# Patient Record
Sex: Female | Born: 1985 | Race: Black or African American | Hispanic: No | Marital: Single | Smoking: Never smoker
Health system: Southern US, Community
[De-identification: ages and names within clinical notes are randomized; demographics above are authoritative.]

## PROBLEM LIST (undated history)

## (undated) HISTORY — PX: EYE SURGERY: SHX253

---
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
## 2008-12-16 ENCOUNTER — Inpatient Hospital Stay (HOSPITAL_COMMUNITY): Admission: AD | Admit: 2008-12-16 | Discharge: 2008-12-16 | Payer: Self-pay | Admitting: Obstetrics & Gynecology

## 2009-02-07 ENCOUNTER — Inpatient Hospital Stay (HOSPITAL_COMMUNITY): Admission: AD | Admit: 2009-02-07 | Discharge: 2009-02-07 | Payer: Self-pay | Admitting: Obstetrics & Gynecology

## 2009-06-12 ENCOUNTER — Emergency Department (HOSPITAL_COMMUNITY): Admission: EM | Admit: 2009-06-12 | Discharge: 2009-06-12 | Payer: Self-pay | Admitting: Family Medicine

## 2010-11-04 LAB — POCT URINALYSIS DIP (DEVICE)
Bilirubin Urine: NEGATIVE
Glucose, UA: NEGATIVE mg/dL
Hgb urine dipstick: NEGATIVE
Ketones, ur: NEGATIVE mg/dL
Nitrite: POSITIVE — AB
Protein, ur: NEGATIVE mg/dL
Specific Gravity, Urine: <=1.005 (ref 1.005–1.030)
Urobilinogen, UA: 0.2 mg/dL (ref 0.0–1.0)
pH: 6 (ref 5.0–8.0)

## 2010-11-04 LAB — POCT PREGNANCY, URINE: Preg Test, Ur: NEGATIVE

## 2010-11-04 LAB — WET PREP, GENITAL
Trich, Wet Prep: NONE SEEN
WBC, Wet Prep HPF POC: NONE SEEN
Yeast Wet Prep HPF POC: NONE SEEN

## 2010-11-04 LAB — GC/CHLAMYDIA PROBE AMP, GENITAL
Chlamydia, DNA Probe: NEGATIVE
GC Probe Amp, Genital: NEGATIVE

## 2010-11-08 LAB — WET PREP, GENITAL: Trich, Wet Prep: NONE SEEN

## 2010-11-08 LAB — GC/CHLAMYDIA PROBE AMP, GENITAL
Chlamydia, DNA Probe: NEGATIVE
GC Probe Amp, Genital: NEGATIVE

## 2010-11-08 LAB — POCT PREGNANCY, URINE: Preg Test, Ur: NEGATIVE

## 2015-01-05 ENCOUNTER — Emergency Department (HOSPITAL_COMMUNITY)
Admission: EM | Admit: 2015-01-05 | Discharge: 2015-01-05 | Disposition: A | Discharge status: Home / Self Care | Payer: Medicaid Other | Attending: Emergency Medicine | Admitting: Emergency Medicine

## 2015-01-05 ENCOUNTER — Encounter (HOSPITAL_COMMUNITY): Payer: Self-pay | Admitting: *Deleted

## 2015-01-05 DIAGNOSIS — Z3202 Encounter for pregnancy test, result negative: Secondary | ICD-10-CM | POA: Insufficient documentation

## 2015-01-05 DIAGNOSIS — R202 Paresthesia of skin: Secondary | ICD-10-CM | POA: Diagnosis present

## 2015-01-05 DIAGNOSIS — B353 Tinea pedis: Secondary | ICD-10-CM | POA: Insufficient documentation

## 2015-01-05 LAB — POC URINE PREG, ED: PREG TEST UR: NEGATIVE

## 2015-01-05 MED ORDER — FLUCONAZOLE 100 MG PO TABS
150.0000 mg | ORAL_TABLET | Freq: Once | ORAL | Status: AC
  Administered 2015-01-05: 150 mg via ORAL
  Filled 2015-01-05: qty 2

## 2015-01-05 MED ORDER — FLUCONAZOLE 150 MG PO TABS: ORAL_TABLET | ORAL | Status: DC

## 2015-01-05 MED ORDER — KETOROLAC TROMETHAMINE 60 MG/2ML IM SOLN: 60.0000 mg | Freq: Once | INTRAMUSCULAR | Status: DC

## 2015-01-05 MED ORDER — TRAMADOL HCL 50 MG PO TABS
50.0000 mg | ORAL_TABLET | Freq: Once | ORAL | Status: AC
  Administered 2015-01-05: 50 mg via ORAL
  Filled 2015-01-05: qty 1

## 2015-01-05 MED ORDER — TRAMADOL HCL 50 MG PO TABS: 50.0000 mg | ORAL_TABLET | Freq: Four times a day (QID) | ORAL | Status: DC | PRN

## 2015-01-05 NOTE — ED Notes (Addendum)
Pt reports blisters between toes on Lt foot  Are worse. Pt reports blisters have been there for 2 years.Mother at bedside and stated "We want this problem fixed today because it could be serious."

## 2015-01-05 NOTE — ED Provider Notes (Signed)
CSN: [id number]     Arrival date & time 01/05/15  [id number] History  This chart was scribed for non-physician practitioner, Oswaldo ConroyVictoria Uzoma Vivona, PA-C, working with Lorre NickAnthony Allen, MD, by Ronney LionSuzanne Le, ED Scribe. This patient was seen in room TR08C/TR08C and the patient's care was started at 10:56 AM.    Chief Complaint  Patient presents with  . Foot Injury   The history is provided by the patient. No language interpreter was used.     HPI Comments: Tracy Hooper is a 29 y.o. female who presents to the Emergency Department complaining of a constant, worsening left foot rash with blisters that began 2 years ago. She complains of associated tingling and burning on top of her foot, as well as purple discoloration that has since resolved. She denies any known injury to her foot. She states she walks frequently, as it is her main means of transportation. This worsens her pain. She states she cleans and washes her feet regularly. Patient has tried several OTC treatments for this, including Lotrimin 1 year ago, which caused the rash to spread all over the top of her toes. She has also tried aloe, several different kinds of oils, soaking it in warm and cold waters, and washing it with bleach, all with no relief. Walking and touching the rash exacerbates the pain. Patient states she has no insurance and subsequently no PCP, and she has trouble walking to doctor's offices because of the pain. She denies fever, chills, nausea, vomiting, or numbness.  History reviewed. No pertinent past medical history. Past Surgical History  Procedure Laterality Date  . Eye surgery    . Cesarean section     History reviewed. No pertinent family history. History  Substance Use Topics  . Smoking status: Never Smoker   . Smokeless tobacco: Never Used  . Alcohol Use: No   OB History    No data available     Review of Systems  Constitutional: Negative for fever and chills.  Gastrointestinal: Negative for nausea and vomiting.   Skin: Positive for color change (resolved) and rash.  Neurological: Negative for numbness.      Allergies  Review of patient's allergies indicates not on file.  Home Medications   Prior to Admission medications   Medication Sig Start Date End Date Taking? Authorizing Provider  fluconazole (DIFLUCAN) 150 MG tablet Take one tablet 01/12/15. 01/05/15   Oswaldo ConroyVictoria Masayo Fera, PA-C  traMADol (ULTRAM) 50 MG tablet Take 1 tablet (50 mg total) by mouth every 6 (six) hours as needed. 01/05/15   Oswaldo ConroyVictoria Alyxandra Tenbrink, PA-C   BP 115/81 mmHg  Pulse 72  Temp(Src) 98.3 F (36.8 C) (Oral)  Resp 16  Ht 5\' 5"  (1.651 m)  Wt 125 lb (56.7 kg)  BMI 20.80 kg/m2  SpO2 100%  LMP 12/30/2014 Physical Exam  Constitutional: She appears well-developed and well-nourished. No distress.  HENT:  Head: Normocephalic and atraumatic.  Eyes: Conjunctivae are normal. Right eye exhibits no discharge. Left eye exhibits no discharge.  Pulmonary/Chest: Effort normal. No respiratory distress.  Musculoskeletal:  2+ DP/PT pulses. To right foot, FROM of left ankle joint without tenderness, swelling, or erythema. Patient with 2 cm blister to second toe, right side, proximally, between toes without spontaneous drainage. Patient also with erythematous rash consistent with fungal rash in between toes at webspace without maceration or evidence of secondary infection; no involvement of toenails. Cap refill <2 seconds. 5/5 Strength in bilateral lower extremity and sensation intact.   Neurological: She is alert. Coordination normal.  Skin: She is not diaphoretic.  Psychiatric: She has a normal mood and affect. Her behavior is normal.  Nursing note and vitals reviewed.   ED Course  Procedures (including critical care time)  DIAGNOSTIC STUDIES: Oxygen Saturation is 100% on RA, normal by my interpretation.    COORDINATION OF CARE: 11:00 AM - Discussed treatment plan with pt at bedside which includes consult with case manager, and pt agreed  to plan.   Labs Review Labs Reviewed  WOUND CULTURE  POC URINE PREG, ED    Imaging Review No results found.   EKG Interpretation None      Meds given in ED:  Medications  traMADol (ULTRAM) tablet 50 mg (50 mg Oral Given 01/05/15 1251)  fluconazole (DIFLUCAN) tablet 150 mg (150 mg Oral Given 01/05/15 1251)    Discharge Medication List as of 01/05/2015  1:40 PM    START taking these medications   Details  fluconazole (DIFLUCAN) 150 MG tablet Take one tablet 01/12/15., Print    traMADol (ULTRAM) 50 MG tablet Take 1 tablet (50 mg total) by mouth every 6 (six) hours as needed., Starting 01/05/2015, Until Discontinued, Print          MDM   Final diagnoses:  Tinea pedis of left foot   Pt presenting with fungal infection chronically for two years exacerbated by frequent ambulation. VSS. No systemic symptoms. No evidence of secondary infection or maceration. Pt failed treatment with OTC medications. Plan for oral fluconazole dose in ED and subsequent dose in one week and follow up with the [facility] health and wellness center. Pt and family demanding culture of the blister and to know the "exact type of fungus," afflicting patient. Family also demanding to see a foot "specialist" in the ED. When I explained that we do not do that routinely in ED and podiatrists are not available on call, mother demanded to speak with my supervisor. She stated that the patient could not afford it to see a specialist and did not have the time to go to an outpatient specialist appointment. I discussed the case with Dr. Freida Busman who evaluated the patient in the ED and recommended culture of blister as well as tx with fluconazole and follow up at [facility] and wellness center. Script for tramadol provided. Driving and sedation precautions provided. I spoke with Burna Mortimer and scheduled patient appointment in 4 days with [facility]  Discussed return precautions with patient. Patient and family  member, mother verbalizes understanding and agrees with plan.  This is a shared patient. This patient was discussed with the physician who saw and evaluated the patient and agrees with the plan.  I personally performed the services described in this documentation, which was scribed in my presence. The recorded information has been reviewed and is accurate.    Oswaldo Conroy, PA-C 01/05/15 1850  Lorre Nick, MD 01/07/15 [phone]

## 2015-01-05 NOTE — ED Provider Notes (Signed)
Medical screening examination/treatment/procedure(s) were conducted as a shared visit with non-physician practitioner(s) and myself.  I personally evaluated the patient during the encounter.   EKG Interpretation None     Patient seen at the request of the physician assistant. Family upset about diagnosis. Physical exam of patient's left foot consistent with fungal infection. Explained treatment plan to patient and her mother. Follow-up given  Lorre NickAnthony Kier Smead, MD 01/05/15 [phone]

## 2015-01-05 NOTE — Discharge Instructions (Signed)
Return to the emergency room with worsening of symptoms, new symptoms or with symptoms that are concerning. Follow up with wellness center as above. Take fluconazole on 01/12/15. Keep area dry. Change socks daily and wear breathable tennis shoes.  Keep gauze between first and second toe for blister. Read below information and follow recommendations.   Athlete's Foot Athlete's foot (tinea pedis) is a fungal infection of the skin on the feet. It often occurs on the skin between the toes or underneath the toes. It can also occur on the soles of the feet. Athlete's foot is more likely to occur in hot, humid weather. Not washing your feet or changing your socks often enough can contribute to athlete's foot. The infection can spread from person to person (contagious). CAUSES Athlete's foot is caused by a fungus. This fungus thrives in warm, moist places. Most people get athlete's foot by sharing shower stalls, towels, and wet floors with an infected person. People with weakened immune systems, including those with diabetes, may be more likely to get athlete's foot. SYMPTOMS   Itchy areas between the toes or on the soles of the feet.  White, flaky, or scaly areas between the toes or on the soles of the feet.  Tiny, intensely itchy blisters between the toes or on the soles of the feet.  Tiny cuts on the skin. These cuts can develop a bacterial infection.  Thick or discolored toenails. DIAGNOSIS  Your caregiver can usually tell what the problem is by doing a physical exam. Your caregiver may also take a skin sample from the rash area. The skin sample may be examined under a microscope, or it may be tested to see if fungus will grow in the sample. A sample may also be taken from your toenail for testing. TREATMENT  Over-the-counter and prescription medicines can be used to kill the fungus. These medicines are available as powders or creams. Your caregiver can suggest medicines for you. Fungal  infections respond slowly to treatment. You may need to continue using your medicine for several weeks. PREVENTION   Do not share towels.  Wear sandals in wet areas, such as shared locker rooms and shared showers.  Keep your feet dry. Wear shoes that allow air to circulate. Wear cotton or wool socks. HOME CARE INSTRUCTIONS   Take medicines as directed by your caregiver. Do not use steroid creams on athlete's foot.  Keep your feet clean and cool. Wash your feet daily and dry them thoroughly, especially between your toes.  Change your socks every day. Wear cotton or wool socks. In hot climates, you may need to change your socks 2 to 3 times per day.  Wear sandals or canvas tennis shoes with good air circulation.  If you have blisters, soak your feet in Burow's solution or Epsom salts for 20 to 30 minutes, 2 times a day to dry out the blisters. Make sure you dry your feet thoroughly afterward. SEEK MEDICAL CARE IF:   You have a fever.  You have swelling, soreness, warmth, or redness in your foot.  You are not getting better after 7 days of treatment.  You are not completely cured after 30 days.  You have any problems caused by your medicines. MAKE SURE YOU:   Understand these instructions.  Will watch your condition.  Will get help right away if you are not doing well or get worse. Document Released: 07/16/2000 Document Revised: 10/11/2011 Document Reviewed: 05/07/2011 [facility] Patient Information 2015 [employer] [place]. This information is not  intended to replace advice given to you by your health care provider. Make sure you discuss any questions you have with your health care provider.

## 2015-01-05 NOTE — ED Notes (Signed)
Declined W/C at D/C and was escorted to lobby by RN. 

## 2015-01-08 LAB — WOUND CULTURE

## 2015-01-09 ENCOUNTER — Inpatient Hospital Stay: Payer: Self-pay

## 2015-11-10 ENCOUNTER — Emergency Department (HOSPITAL_COMMUNITY)
Admission: EM | Admit: 2015-11-10 | Discharge: 2015-11-11 | Disposition: A | Discharge status: Home / Self Care | Payer: Medicaid Other | Attending: Emergency Medicine | Admitting: Emergency Medicine

## 2015-11-10 DIAGNOSIS — H578 Other specified disorders of eye and adnexa: Secondary | ICD-10-CM | POA: Insufficient documentation

## 2015-11-10 DIAGNOSIS — K0889 Other specified disorders of teeth and supporting structures: Secondary | ICD-10-CM | POA: Insufficient documentation

## 2015-11-10 NOTE — ED Notes (Signed)
Pt in c/o toothache for the last few weeks, states she has some teeth coming in, increased pain tonight, tearful in triage

## 2015-11-10 NOTE — ED Provider Notes (Signed)
CSN: [id number]     Arrival date & time 11/10/15  2238 History   First MD Initiated Contact with Patient 11/10/15 2341     Chief Complaint  Patient presents with  . Dental Pain     (Consider location/radiation/quality/duration/timing/severity/associated sxs/prior Treatment) HPI  30 year old female who presents with dental pain. Otherwise healthy. States several weeks of dental pain with inability to afford dentist currently. States she felt something pushing on her teeth from the back of her mouth on the right lower jaw and the left upper jaw, worsening. Take ibuprofen and tylenol at home without good effect. No significant oropharyngeal swelling, difficulty breathing, fevers, chills, or drainage.    No past medical history on file. Past Surgical History  Procedure Laterality Date  . Eye surgery    . Cesarean section     No family history on file. Social History  Substance Use Topics  . Smoking status: Never Smoker   . Smokeless tobacco: Never Used  . Alcohol Use: No   OB History    No data available     Review of Systems  Constitutional: Negative for fever.  HENT: Positive for dental problem.   Eyes: Positive for discharge (increased clear drainage bilaterally).  Respiratory: Negative for shortness of breath.   All other systems reviewed and are negative.     Allergies  Review of patient's allergies indicates no known allergies.  Home Medications   Prior to Admission medications   Medication Sig Start Date End Date Taking? Authorizing Provider  acetaminophen-codeine (TYLENOL #3) 300-30 MG tablet Take 1-2 tablets by mouth every 6 (six) hours as needed for moderate pain. 11/11/15   Lavera Guise, MD  fluconazole (DIFLUCAN) 150 MG tablet Take one tablet 01/12/15. 01/05/15   Oswaldo Conroy, PA-C  traMADol (ULTRAM) 50 MG tablet Take 1 tablet (50 mg total) by mouth every 6 (six) hours as needed. 01/05/15   Oswaldo Conroy, PA-C   BP 136/97 mmHg  Pulse 91  Temp(Src) 99.2 F  (37.3 C) (Oral)  Resp 20  Ht  (1.651 m)  Wt 128 lb 3 oz (58.145 kg)  BMI 21.33 kg/m2  SpO2 97%  LMP 10/19/2015 Physical Exam  Constitutional: She is oriented to person, place, and time. She appears well-developed and well-nourished.  HENT:  Head: Normocephalic and atraumatic.  Mouth/Throat: Oropharynx is clear and moist.  Wisdom teeth in right lower mandible and left upper jaw growing in with mild gingival swelling  Eyes: Conjunctivae are normal.  Neck: Normal range of motion. Neck supple.  Cardiovascular: Normal rate and regular rhythm.   Pulmonary/Chest: Effort normal.  Abdominal: Soft.  Musculoskeletal: Normal range of motion.  Neurological: She is alert and oriented to person, place, and time.  Skin: Skin is warm and dry.  Psychiatric: She has a normal mood and affect.    ED Course  Procedures (including critical care time) Labs Review Labs Reviewed - No data to display  Imaging Review No results found. I have personally reviewed and evaluated these images and lab results as part of my medical decision-making.   EKG Interpretation None      MDM   Final diagnoses:  Pain, dental    30 year old female who presents with dental pain. She is nontoxic in no acute distress but difficult comfortable on presentation. Vital signs are stable. On exam she does appear to have wisdom teeth growing in in the right lower jaw and left upper jaw. Mild gingival irritation but no evidence of abscess  or infection. She is given follow-up resources for dental care. She will continue taking Motrin and given prescription for Tylenol 3. Strict return follow-up instructions are reviewed. She expressed understanding of all discharge instructions, and felt comfortable with the plan of care.    Lavera Guiseana Duo Blaklee [place], MD 11/11/15 [phone]

## 2015-11-11 MED ORDER — ACETAMINOPHEN-CODEINE #3 300-30 MG PO TABS: 1.0000 | ORAL_TABLET | Freq: Four times a day (QID) | ORAL | Status: DC | PRN

## 2015-11-11 NOTE — Discharge Instructions (Signed)
Your wisdom teeth are coming in and causing you pain. Please follow-up with dentistry. Eat soft diet if having difficulty chewing (i.e. Yogurt, pudding, apple sauce, milkshakes, etc). Continue taking ibuprofen at home 600 mg every 6 hours with food/crackers.  Return for worsening symptoms, including difficulty breathing, swelling, or any other symptosm concerning to you.   [employer] [facility] [employer] is a great source of information about community services available.  Access by dialing 2-1-1 from anywhere in [place], or by website -  PooledIncome.pl.   Other Local Resources (Updated 08/2015)  Dental  Care   Services    Phone Number and Address  Cost  [facility] For children 93 - 38 years of age:   Cleaning  Tooth brushing/flossing instruction  Sealants, fillings, crowns  Extractions  Emergency treatment  [phone] [address] [place], [place] [address] Charges based on family income.  Medicaid and some insurance plans accepted.     [place] [facility] - [facility], fillings, crowns  Extractions  Emergency treatment [phone] [address] [place], [place]  Pregnant women 36 years of age or older with a Medicaid card  [place] [facility] - [place]  Cleaning  Sealants, fillings, crowns  Extractions  Emergency treatment [phone] [address] [place], [place] Pregnant women 24 years of age or older with a Medicaid card  [facility] Department of Health - [facility] For children 24 - 32 years of age:   Cleaning  Tooth brushing/flossing instruction  Sealants, fillings, crowns  Extractions  Emergency treatment Limited orthodontic services for patients with Medicaid [phone] [address] [place], [place] [address] Medicaid and [facility] Health Choice cover for children up to age 41 and  pregnant women.  Parents of children up to age 91 without Medicaid pay a reduced fee at time of service.  [facility] Department of [employer] For children 25 - 61 years of age:   Cleaning  Tooth brushing/flossing instruction  Sealants, fillings, crowns  Extractions  Emergency treatment Limited orthodontic services for patients with Medicaid [phone] [address] [place], [place].  Medicaid and [place] Health Choice cover for children up to age 57 and pregnant women.  Parents of children up to age 45 without Medicaid pay a reduced fee.  [facility]  Sealants, fillings, crowns  Extractions  Hours: Tuesdays and Thursdays, 4:15 - 8 pm [phone] [address] [place], [place] [address] Services free of charge to [facility] residents ages 18-64 who do not have health insurance, Medicare, [place], or [place] benefits and fall within federal poverty guidelines  [employer]    Provides dental care in addition to primary medical care, nutritional counseling, and pharmacy:  Nurse, mental health, fillings, crowns  Extractions                  [phone] [facility], [address] [place], [place]  [phone] [facility], [address] [place], [place]  [phone] [facility] [place], [place]  [phone] [facility], [address] [place], [place]  [phone] [facility] [address] [place], [place] Accepts [place], [place], most insurance.  Also provides services available to all with fees adjusted based on ability to pay.    [facility] Division of  [facility]  Cleaning  Tooth brushing/flossing instruction  Sealants, fillings, crowns  Extractions  Emergency treatment Hours: Tuesdays, Thursdays, and Fridays from 8  am to 5 pm by appointment only. [phone] [address] [place], [place] [phone] [facility] residents with Medicaid (depending on eligibility) and children with [facility] Health Choice - call for more information.  [facility]  Extractions only  Hours: 2nd and 4th Thursday of each month from 6:30 am - 9 am.   [phone]. [phone] [place], [place] [address] Ages 3918 and older only.  Patients are seen on a first come, first served basis.  [employer]  [employer]  Fillings  Extractions  Orthodontics  Endodontics  Implants/Crowns/Bridges  Complete and partial dentures [phone] [place],  Patients must complete an application for services.  There is often a waiting list.

## 2016-06-21 ENCOUNTER — Ambulatory Visit (HOSPITAL_COMMUNITY)
Admission: EM | Admit: 2016-06-21 | Discharge: 2016-06-21 | Disposition: A | Discharge status: Home / Self Care | Payer: Medicaid Other | Attending: Emergency Medicine | Admitting: Emergency Medicine

## 2016-06-21 ENCOUNTER — Encounter (HOSPITAL_COMMUNITY): Payer: Self-pay | Admitting: Emergency Medicine

## 2016-06-21 DIAGNOSIS — B9689 Other specified bacterial agents as the cause of diseases classified elsewhere: Secondary | ICD-10-CM

## 2016-06-21 DIAGNOSIS — Z79899 Other long term (current) drug therapy: Secondary | ICD-10-CM | POA: Insufficient documentation

## 2016-06-21 DIAGNOSIS — N760 Acute vaginitis: Secondary | ICD-10-CM

## 2016-06-21 DIAGNOSIS — L292 Pruritus vulvae: Secondary | ICD-10-CM | POA: Diagnosis present

## 2016-06-21 MED ORDER — METRONIDAZOLE 500 MG PO TABS: 500.0000 mg | ORAL_TABLET | Freq: Two times a day (BID) | ORAL | 0 refills | Status: DC

## 2016-06-21 MED ORDER — FLUCONAZOLE 150 MG PO TABS: 150.0000 mg | ORAL_TABLET | Freq: Every day | ORAL | 0 refills | Status: DC

## 2016-06-21 NOTE — ED Triage Notes (Signed)
PT reports intermittent vaginal itching, burning, and irritation. PT has tried "refresh." PT has tried monistat and PH balance wash.

## 2016-06-21 NOTE — ED Provider Notes (Signed)
CSN: [id number]     Arrival date & time 06/21/16  1620 History   None    Chief Complaint  Patient presents with  . Vaginal Itching   (Consider location/radiation/quality/duration/timing/severity/associated sxs/prior Treatment) The history is provided by the patient. No language interpreter was used.  Vaginal Itching  This is a new problem. The problem occurs constantly. The problem has been gradually worsening. Nothing aggravates the symptoms. Nothing relieves the symptoms. She has tried nothing for the symptoms. The treatment provided no relief.  no relief with vinegar wash.   History reviewed. No pertinent past medical history. Past Surgical History:  Procedure Laterality Date  . CESAREAN SECTION    . EYE SURGERY     No family history on file. Social History  Substance Use Topics  . Smoking status: Never Smoker  . Smokeless tobacco: Never Used  . Alcohol use No   OB History    No data available     Review of Systems  All other systems reviewed and are negative.   Allergies  Patient has no known allergies.  Home Medications   Prior to Admission medications   Medication Sig Start Date End Date Taking? Authorizing Provider  acetaminophen-codeine (TYLENOL #3) 300-30 MG tablet Take 1-2 tablets by mouth every 6 (six) hours as needed for moderate pain. 11/11/15   Lavera Guiseana Duo Liu, MD  fluconazole (DIFLUCAN) 150 MG tablet Take one tablet 01/12/15. 01/05/15   Oswaldo ConroyVictoria Creech, PA-C  traMADol (ULTRAM) 50 MG tablet Take 1 tablet (50 mg total) by mouth every 6 (six) hours as needed. 01/05/15   Oswaldo ConroyVictoria Creech, PA-C   Meds Ordered and Administered this Visit  Medications - No data to display  BP 144/93 (BP Location: Left Arm)   Pulse 71   Temp 98 F (36.7 C) (Oral)   Resp 14   Ht 5\' 4"  (1.626 m)   Wt 120 lb (54.4 kg)   LMP 05/31/2016   SpO2 100%   BMI 20.60 kg/m  No data found.   Physical Exam  Constitutional: She appears well-developed and well-nourished.  HENT:  Head:  Normocephalic and atraumatic.  Right Ear: External ear normal.  Left Ear: External ear normal.  Eyes: Conjunctivae are normal. Pupils are equal, round, and reactive to light.  Neck: Normal range of motion. Neck supple.  Cardiovascular: Normal rate and regular rhythm.   Pulmonary/Chest: Effort normal.  Abdominal: Soft. There is no tenderness.  Genitourinary: Vaginal discharge found.  Genitourinary Comments: Vaginal discharge,  Thick white,  Adnexa no masses,  Cervix nontender  Musculoskeletal: Normal range of motion.  Skin: Skin is warm.  Psychiatric: She has a normal mood and affect.  Nursing note and vitals reviewed.   Urgent Care Course   Clinical Course     Procedures (including critical care time)  Labs Review Labs Reviewed - No data to display  Imaging Review No results found.   Visual Acuity Review  Right Eye Distance:   Left Eye Distance:   Bilateral Distance:    Right Eye Near:   Left Eye Near:    Bilateral Near:         MDM gc and ct    1. BV (bacterial vaginosis)    Meds ordered this encounter  Medications  . metroNIDAZOLE (FLAGYL) 500 MG tablet    Sig: Take 1 tablet (500 mg total) by mouth 2 (two) times daily.    Dispense:  14 tablet    Refill:  0    Order Specific Question:  Supervising Provider    Answer:   Domenick GongMORTENSON, ASHLEY [[id number]]  . fluconazole (DIFLUCAN) 150 MG tablet    Sig: Take 1 tablet (150 mg total) by mouth daily.    Dispense:  1 tablet    Refill:  0    Order Specific Question:   Supervising Provider    Answer:   Domenick GongMORTENSON, ASHLEY [[id number]]  An After Visit Summary was printed and given to the patient.    Elson AreasLeslie K Demaris Bousquet, PA-C 06/21/16 1826    Elson AreasLeslie K Roben Tatsch, PA-C 06/21/16 [phone]

## 2016-06-22 LAB — CERVICOVAGINAL ANCILLARY ONLY
CHLAMYDIA, DNA PROBE: NEGATIVE
NEISSERIA GONORRHEA: POSITIVE — AB
Wet Prep (BD Affirm): POSITIVE — AB

## 2016-06-23 ENCOUNTER — Telehealth (HOSPITAL_COMMUNITY): Payer: Self-pay | Admitting: Emergency Medicine

## 2016-06-23 NOTE — Telephone Encounter (Signed)
-----   Message from Eustace MooreLaura W Machuca, MD sent at 06/23/2016  8:35 AM EST ----- Clinical staff, please let patient and health department know that test for gonorrhea was positive.  Needs to return to UC for 250mg  IM rocephin and 1000mg  po zithromax. Sexual partners need to be notified and tested/treated.  Tests for gardnerella (bacterial vaginosis) and trichomonas were also positive; [facility].  LM

## 2016-06-23 NOTE — Telephone Encounter (Signed)
LM on [phone] Need to give lab results and to see how pt is doing from recent visit on 06/23/16 Will wait until pt comes in and gets treated to [facility]

## 2016-06-23 NOTE — Telephone Encounter (Signed)
Pt called back.... notified of recent lab results Pt ID'd properly... Reports feeling a little bit better. Pt reports she will be here on 11/24 to get treated.  Education on safe sex given Also adv pt to notify partner(s) Will wait for pt to get treated to [facility] Pt verb understanding.

## 2016-06-25 ENCOUNTER — Encounter (HOSPITAL_COMMUNITY): Payer: Self-pay

## 2016-06-25 ENCOUNTER — Ambulatory Visit (HOSPITAL_COMMUNITY)
Admission: EM | Admit: 2016-06-25 | Discharge: 2016-06-25 | Disposition: A | Discharge status: Home / Self Care | Payer: Medicaid Other | Attending: Emergency Medicine | Admitting: Emergency Medicine

## 2016-06-25 DIAGNOSIS — K429 Umbilical hernia without obstruction or gangrene: Secondary | ICD-10-CM

## 2016-06-25 DIAGNOSIS — A64 Unspecified sexually transmitted disease: Secondary | ICD-10-CM

## 2016-06-25 MED ORDER — LIDOCAINE HCL (PF) 1 % IJ SOLN
INTRAMUSCULAR | Status: AC
Start: 2016-06-25 — End: 2016-06-25
  Filled 2016-06-25: qty 2

## 2016-06-25 MED ORDER — CEFTRIAXONE SODIUM 250 MG IJ SOLR
INTRAMUSCULAR | Status: AC
  Filled 2016-06-25: qty 250

## 2016-06-25 MED ORDER — AZITHROMYCIN 250 MG PO TABS
ORAL_TABLET | ORAL | Status: AC
  Filled 2016-06-25: qty 4

## 2016-06-25 MED ORDER — CEFTRIAXONE SODIUM 250 MG IJ SOLR
250.0000 mg | Freq: Once | INTRAMUSCULAR | Status: AC
  Administered 2016-06-25: 250 mg via INTRAMUSCULAR

## 2016-06-25 MED ORDER — AZITHROMYCIN 250 MG PO TABS
1000.0000 mg | ORAL_TABLET | Freq: Once | ORAL | Status: AC
  Administered 2016-06-25: 1000 mg via ORAL

## 2016-06-25 NOTE — ED Provider Notes (Signed)
[facility]    CSN: [id number] Arrival date & time: 06/25/16  1824     History   Chief Complaint Chief Complaint  Patient presents with  . SEXUALLY TRANSMITTED DISEASE  . Bloated    HPI Tracy Hooper is a 30 y.o. female.   HPI She's a 30 year old woman here for STD follow-up. She was seen here on November 20 and tested for STDs. Gonorrhea came back positive. She is here for treatment.  She also reports a one-year history of intermittent discomfort around her bellybutton. This is associated with bulging that comes and goes. She works at [employer] and does a lot of lifting. She states it tends to get worse if she is constipated and when lifting heavy things.  History reviewed. No pertinent past medical history.  There are no active problems to display for this patient.   Past Surgical History:  Procedure Laterality Date  . CESAREAN SECTION    . EYE SURGERY      OB History    No data available       Home Medications    Prior to Admission medications   Medication Sig Start Date End Date Taking? Authorizing Provider  metroNIDAZOLE (FLAGYL) 500 MG tablet Take 1 tablet (500 mg total) by mouth 2 (two) times daily. 06/21/16  Yes Lonia SkinnerLeslie K Sofia, PA-C  acetaminophen-codeine (TYLENOL #3) 300-30 MG tablet Take 1-2 tablets by mouth every 6 (six) hours as needed for moderate pain. 11/11/15   Lavera Guiseana Duo Liu, MD  fluconazole (DIFLUCAN) 150 MG tablet Take 1 tablet (150 mg total) by mouth daily. 06/21/16   Elson AreasLeslie K Sofia, PA-C  traMADol (ULTRAM) 50 MG tablet Take 1 tablet (50 mg total) by mouth every 6 (six) hours as needed. 01/05/15   Oswaldo ConroyVictoria Creech, PA-C    Family History No family history on file.  Social History Social History  Substance Use Topics  . Smoking status: Never Smoker  . Smokeless tobacco: Never Used  . Alcohol use No     Allergies   Patient has no known allergies.   Review of Systems Review of Systems As in history of present  illness  Physical Exam Triage Vital Signs ED Triage Vitals  Enc Vitals Group     BP      Pulse      Resp      Temp      Temp src      SpO2      Weight      Height      Head Circumference      Peak Flow      Pain Score      Pain Loc      Pain Edu?      Excl. in GC?    No data found.   Updated Vital Signs BP 118/78 (BP Location: Right Arm)   Pulse 76   Temp 98.5 F (36.9 C) (Oral)   Resp 16   LMP 06/25/2016 (Exact Date)   SpO2 100%   Visual Acuity Right Eye Distance:   Left Eye Distance:   Bilateral Distance:    Right Eye Near:   Left Eye Near:    Bilateral Near:     Physical Exam  Constitutional: She is oriented to person, place, and time. She appears well-developed and well-nourished. No distress.  Cardiovascular: Normal rate.   Pulmonary/Chest: Effort normal.  Abdominal: Soft. She exhibits no distension.  She has a small and easily reduced umbilical hernia. Reduction of  hernia alleviated her discomfort.  Neurological: She is alert and oriented to person, place, and time.     UC Treatments / Results  Labs (all labs ordered are listed, but only abnormal results are displayed) Labs Reviewed - No data to display  EKG  EKG Interpretation None       Radiology No results found.  Procedures Procedures (including critical care time)  Medications Ordered in UC Medications  azithromycin (ZITHROMAX) tablet 1,000 mg (1,000 mg Oral Given 06/25/16 1906)  cefTRIAXone (ROCEPHIN) injection 250 mg (250 mg Intramuscular Given 06/25/16 1906)     Initial Impression / Assessment and Plan / UC Course  I have reviewed the triage vital signs and the nursing notes.  Pertinent labs & imaging results that were available during my care of the patient were reviewed by me and considered in my medical decision making (see chart for details).  Clinical Course     Treated for STDs today. Reassurance and information provided on umbilical hernias.  Final Clinical  Impressions(s) / UC Diagnoses   Final diagnoses:  STD (female)  Umbilical hernia without obstruction and without gangrene    New Prescriptions Discharge Medication List as of 06/25/2016  6:54 PM       Charm RingsErin J Peniel Hass, MD 06/25/16 1949

## 2016-06-25 NOTE — Discharge Instructions (Signed)
You have a small umbilical hernia. You can wear an abdominal binder at work to see if this helps. If it becomes problematic, you can see a surgeon to get it fixed.

## 2016-06-25 NOTE — ED Triage Notes (Signed)
Pt wants to get treated for gonorrhea said she had the results the other day and came to get the rocephin and zithromax. Also said she is having a lump in her abdomen for a year but comes and goes. Painful to touch and hurts more when she is straining at work.

## 2016-06-29 NOTE — Telephone Encounter (Signed)
Pt treated on 11/24... [facility]

## 2016-06-30 ENCOUNTER — Encounter (HOSPITAL_COMMUNITY): Payer: Self-pay | Admitting: Emergency Medicine

## 2016-06-30 ENCOUNTER — Ambulatory Visit (HOSPITAL_COMMUNITY)
Admission: EM | Admit: 2016-06-30 | Discharge: 2016-06-30 | Disposition: A | Discharge status: Home / Self Care | Payer: Medicaid Other | Attending: Family Medicine | Admitting: Family Medicine

## 2016-06-30 DIAGNOSIS — K529 Noninfective gastroenteritis and colitis, unspecified: Secondary | ICD-10-CM | POA: Diagnosis not present

## 2016-06-30 NOTE — ED Triage Notes (Signed)
PT reports she got very sick Monday and had nausea/vomiting/diarrhea. PT is feeling better day by day and would like a noted to return to work . PT had 2 episodes of diarrhea today. PT is eating and drinking today.

## 2016-06-30 NOTE — ED Provider Notes (Signed)
[facility]    CSN: [id number] Arrival date & time: 06/30/16  1819     History   Chief Complaint Chief Complaint  Patient presents with  . Diarrhea    HPI Tracy FountainDaphne R Hooper is a 30 y.o. female.   The history is provided by the patient and a parent.  Diarrhea  Quality:  Watery Severity:  Mild Onset quality:  Gradual Duration:  3 days Progression:  Improving Associated symptoms: vomiting   Associated symptoms: no abdominal pain and no fever   Risk factors: sick contacts     History reviewed. No pertinent past medical history.  There are no active problems to display for this patient.   Past Surgical History:  Procedure Laterality Date  . CESAREAN SECTION    . EYE SURGERY      OB History    No data available       Home Medications    Prior to Admission medications   Medication Sig Start Date End Date Taking? Authorizing Provider  acetaminophen-codeine (TYLENOL #3) 300-30 MG tablet Take 1-2 tablets by mouth every 6 (six) hours as needed for moderate pain. 11/11/15   Lavera Guiseana Duo Liu, MD  fluconazole (DIFLUCAN) 150 MG tablet Take 1 tablet (150 mg total) by mouth daily. 06/21/16   Elson AreasLeslie K Sofia, PA-C  metroNIDAZOLE (FLAGYL) 500 MG tablet Take 1 tablet (500 mg total) by mouth 2 (two) times daily. 06/21/16   Elson AreasLeslie K Sofia, PA-C  traMADol (ULTRAM) 50 MG tablet Take 1 tablet (50 mg total) by mouth every 6 (six) hours as needed. 01/05/15   Oswaldo ConroyVictoria Creech, PA-C    Family History No family history on file.  Social History Social History  Substance Use Topics  . Smoking status: Never Smoker  . Smokeless tobacco: Never Used  . Alcohol use No     Allergies   Patient has no known allergies.   Review of Systems Review of Systems  Constitutional: Negative.  Negative for fever.  Respiratory: Negative.   Cardiovascular: Negative.   Gastrointestinal: Positive for diarrhea, nausea and vomiting. Negative for abdominal pain.  Genitourinary: Negative.     Musculoskeletal: Negative.   Neurological: Negative.   All other systems reviewed and are negative.    Physical Exam Triage Vital Signs ED Triage Vitals  Enc Vitals Group     BP 06/30/16 1946 108/76     Pulse Rate 06/30/16 1946 90     Resp 06/30/16 1946 16     Temp 06/30/16 1946 98.2 F (36.8 C)     Temp Source 06/30/16 1946 Oral     SpO2 06/30/16 1946 98 %     Weight 06/30/16 1947 115 lb (52.2 kg)     Height 06/30/16 1947 5\' 5"  (1.651 m)     Head Circumference --      Peak Flow --      Pain Score 06/30/16 1947 4     Pain Loc --      Pain Edu? --      Excl. in GC? --    No data found.   Updated Vital Signs BP 108/76   Pulse 90   Temp 98.2 F (36.8 C) (Oral)   Resp 16   Ht 5\' 5"  (1.651 m)   Wt 115 lb (52.2 kg)   LMP 06/25/2016 (Exact Date)   SpO2 98%   BMI 19.14 kg/m   Visual Acuity Right Eye Distance:   Left Eye Distance:   Bilateral Distance:    Right  Eye Near:   Left Eye Near:    Bilateral Near:     Physical Exam  Constitutional: She is oriented to person, place, and time. She appears well-developed and well-nourished. No distress.  HENT:  Right Ear: External ear normal.  Left Ear: External ear normal.  Nose: Nose normal.  Mouth/Throat: Oropharynx is clear and moist.  Neck: Normal range of motion. Neck supple.  Pulmonary/Chest: Effort normal and breath sounds normal.  Abdominal: Soft. Bowel sounds are normal. She exhibits no mass. There is no tenderness. There is no rebound and no guarding. No hernia.  Neurological: She is alert and oriented to person, place, and time.  Nursing note and vitals reviewed.    UC Treatments / Results  Labs (all labs ordered are listed, but only abnormal results are displayed) Labs Reviewed - No data to display  EKG  EKG Interpretation None       Radiology No results found.  Procedures Procedures (including critical care time)  Medications Ordered in UC Medications - No data to display   Initial  Impression / Assessment and Plan / UC Course  I have reviewed the triage vital signs and the nursing notes.  Pertinent labs & imaging results that were available during my care of the patient were reviewed by me and considered in my medical decision making (see chart for details).  Clinical Course       Final Clinical Impressions(s) / UC Diagnoses   Final diagnoses:  None    New Prescriptions New Prescriptions   No medications on file     Linna HoffJames D Moya Duan, MD 06/30/16 2036

## 2016-06-30 NOTE — Discharge Instructions (Signed)
Clear liquid diet tonight as tolerated, advance on thurs as improved, use medicine as needed, return or see your doctor if any problems. °

## 2017-12-28 ENCOUNTER — Encounter (HOSPITAL_COMMUNITY): Payer: Self-pay | Admitting: Emergency Medicine

## 2017-12-28 ENCOUNTER — Ambulatory Visit (HOSPITAL_COMMUNITY)
Admission: EM | Admit: 2017-12-28 | Discharge: 2017-12-28 | Disposition: A | Discharge status: Home / Self Care | Payer: Medicaid Other | Attending: Family Medicine | Admitting: Family Medicine

## 2017-12-28 DIAGNOSIS — A6000 Herpesviral infection of urogenital system, unspecified: Secondary | ICD-10-CM

## 2017-12-28 MED ORDER — VALACYCLOVIR HCL 500 MG PO TABS: 500.0000 mg | ORAL_TABLET | Freq: Every day | ORAL | 5 refills | Status: DC

## 2017-12-28 NOTE — ED Provider Notes (Signed)
  [facility]   [id number] 12/28/17 Arrival Time: 1251  ASSESSMENT & PLAN:  1. Genital herpes simplex, unspecified site    Meds ordered this encounter  Medications  . valACYclovir (VALTREX) 500 MG tablet    Sig: Take 1 tablet (500 mg total) by mouth daily.    Dispense:  30 tablet    Refill:  5   Encouraged her to est care with primary provider.  Reviewed expectations re: course of current medical issues. Questions answered. Outlined signs and symptoms indicating need for more acute intervention. Patient verbalized understanding. After Visit Summary given.   SUBJECTIVE:  Tracy Hooper is a 32 y.o. female with a PMH of genital herpes. Reports <10 outbreaks per year. Would like to start suppressive therapy. Otherwise well. Last outbreak over the past week. Resolving. Does not usually take anti-virals with outbreaks. New female sexual partner. No abdominal pain or vaginal discharge.  ROS: As per HPI.  OBJECTIVE: Vitals:   12/28/17 1318  BP: 116/79  Pulse: 70  Resp: 18  Temp: (!) 97.3 F (36.3 C)  SpO2: 100%    General appearance: alert; no distress Lungs: clear to auscultation bilaterally Heart: regular rate and rhythm Extremities: no edema GU: deferred Skin: warm and dry Psychological: alert and cooperative; normal mood and affect  No Known Allergies   Social History   Socioeconomic History  . Marital status: Single    Spouse name: Not on file  . Number of children: Not on file  . Years of education: Not on file  . Highest education level: Not on file  Occupational History  . Not on file  Social Needs  . Financial resource strain: Not on file  . Food insecurity:    Worry: Not on file    Inability: Not on file  . Transportation needs:    Medical: Not on file    Non-medical: Not on file  Tobacco Use  . Smoking status: Never Smoker  . Smokeless tobacco: Never Used  Substance and Sexual Activity  . Alcohol use: No  . Drug use: Yes    Types:  Marijuana  . Sexual activity: Not on file  Lifestyle  . Physical activity:    Days per week: Not on file    Minutes per session: Not on file  . Stress: Not on file  Relationships  . Social connections:    Talks on phone: Not on file    Gets together: Not on file    Attends religious service: Not on file    Active member of club or organization: Not on file    Attends meetings of clubs or organizations: Not on file    Relationship status: Not on file  . Intimate partner violence:    Fear of current or ex partner: Not on file    Emotionally abused: Not on file    Physically abused: Not on file    Forced sexual activity: Not on file  Other Topics Concern  . Not on file  Social History Narrative  . Not on file     Past Surgical History:  Procedure Laterality Date  . CESAREAN SECTION    . EYE SURGERY       Mardella Layman, MD 12/28/17 1447

## 2017-12-28 NOTE — ED Triage Notes (Signed)
Pt states "ten years ago they told me I had herpes, i've been more sexually active, for the last few days I can see the rash coming through". Pt requesting medication for it. Pt also trying to find medication for birth control.

## 2019-04-01 ENCOUNTER — Other Ambulatory Visit: Payer: Self-pay

## 2019-04-01 ENCOUNTER — Ambulatory Visit (HOSPITAL_COMMUNITY)
Admission: EM | Admit: 2019-04-01 | Discharge: 2019-04-01 | Disposition: A | Discharge status: Home / Self Care | Payer: Medicaid Other | Attending: Family Medicine | Admitting: Family Medicine

## 2019-04-01 ENCOUNTER — Encounter (HOSPITAL_COMMUNITY): Payer: Self-pay

## 2019-04-01 DIAGNOSIS — K047 Periapical abscess without sinus: Secondary | ICD-10-CM

## 2019-04-01 DIAGNOSIS — K0889 Other specified disorders of teeth and supporting structures: Secondary | ICD-10-CM

## 2019-04-01 MED ORDER — PENICILLIN V POTASSIUM 500 MG PO TABS: 500.0000 mg | ORAL_TABLET | Freq: Four times a day (QID) | ORAL | 0 refills | Status: AC

## 2019-04-01 MED ORDER — HYDROCODONE-ACETAMINOPHEN 7.5-325 MG PO TABS: 1.0000 | ORAL_TABLET | Freq: Four times a day (QID) | ORAL | 0 refills | Status: DC | PRN

## 2019-04-01 NOTE — ED Provider Notes (Signed)
[facility]    CSN: [id number] Arrival date & time: 04/01/19  1311      History   Chief Complaint Chief Complaint  Patient presents with  . Dental Pain    HPI Tracy Hooper is a 33 y.o. female.   HPI  Healthy 33 year old.  Here for dental pain.  Her whole right jaw is painful and swollen.  This is been going on for a couple of days.  She has been trying ibuprofen.  It does not help.  Kept her awake last night.  She was having difficulty eating solid food.  History reviewed. No pertinent past medical history.  There are no active problems to display for this patient.   Past Surgical History:  Procedure Laterality Date  . CESAREAN SECTION    . EYE SURGERY      OB History   No obstetric history on file.      Home Medications    Prior to Admission medications   Medication Sig Start Date End Date Taking? Authorizing Provider  HYDROcodone-acetaminophen (NORCO) 7.5-325 MG tablet Take 1 tablet by mouth every 6 (six) hours as needed for moderate pain. 04/01/19   Eustace MooreNelson, Angelis Gates Sue, MD  penicillin v potassium (VEETID) 500 MG tablet Take 1 tablet (500 mg total) by mouth 4 (four) times daily for 7 days. 04/01/19 04/08/19  Eustace MooreNelson, Haru Shaff Sue, MD  valACYclovir (VALTREX) 500 MG tablet Take 1 tablet (500 mg total) by mouth daily. 12/28/17   Mardella LaymanHagler, Brian, MD    Family History Family History  Family history unknown: Yes    Social History Social History   Tobacco Use  . Smoking status: Never Smoker  . Smokeless tobacco: Never Used  Substance Use Topics  . Alcohol use: No  . Drug use: Yes    Types: Marijuana     Allergies   Patient has no known allergies.   Review of Systems Review of Systems  Constitutional: Negative for chills and fever.  HENT: Positive for dental problem. Negative for ear pain and sore throat.   Eyes: Negative for pain and visual disturbance.  Respiratory: Negative for cough and shortness of breath.   Cardiovascular: Negative for  chest pain and palpitations.  Gastrointestinal: Negative for abdominal pain and vomiting.  Genitourinary: Negative for dysuria and hematuria.  Musculoskeletal: Negative for arthralgias and back pain.  Skin: Negative for color change and rash.  Neurological: Negative for seizures and syncope.  Psychiatric/Behavioral: Positive for sleep disturbance.  All other systems reviewed and are negative.    Physical Exam Triage Vital Signs ED Triage Vitals [04/01/19 1337]  Enc Vitals Group     BP 127/83     Pulse Rate 75     Resp 18     Temp 98.7 F (37.1 C)     Temp Source Oral     SpO2 97 %     Weight      Height      Head Circumference      Peak Flow      Pain Score 10     Pain Loc      Pain Edu?      Excl. in GC?    No data found.  Updated Vital Signs BP 127/83 (BP Location: Left Arm)   Pulse 75   Temp 98.7 F (37.1 C) (Oral)   Resp 18   SpO2 97%      Physical Exam Constitutional:      General: She is not in acute  distress.    Appearance: She is well-developed.  HENT:     Head: Normocephalic and atraumatic.     Mouth/Throat:   Eyes:     Conjunctiva/sclera: Conjunctivae normal.     Pupils: Pupils are equal, round, and reactive to light.  Neck:     Musculoskeletal: Normal range of motion.  Cardiovascular:     Rate and Rhythm: Normal rate.  Pulmonary:     Effort: Pulmonary effort is normal. No respiratory distress.  Abdominal:     General: There is no distension.     Palpations: Abdomen is soft.  Musculoskeletal: Normal range of motion.  Lymphadenopathy:     Cervical: Cervical adenopathy present.  Skin:    General: Skin is warm and dry.  Neurological:     Mental Status: She is alert.      UC Treatments / Results  Labs (all labs ordered are listed, but only abnormal results are displayed) Labs Reviewed - No data to display  EKG   Radiology No results found.  Procedures Procedures (including critical care time)  Medications Ordered in UC  Medications - No data to display  Initial Impression / Assessment and Plan / UC Course  I have reviewed the triage vital signs and the nursing notes.  Pertinent labs & imaging results that were available during my care of the patient were reviewed by me and considered in my medical decision making (see chart for details).      Final Clinical Impressions(s) / UC Diagnoses   Final diagnoses:  Pain, dental  Dental infection     Discharge Instructions     Take the penicillin 4 times a day as prescribed Take ibuprofen for mild to moderate pain Take hydrocodone for severe pain.  Do not drive on hydrocodone. Call your dentist on Monday.  You need to be seen within the next week or 21   ED Prescriptions    Medication Sig Dispense Auth. Provider   penicillin v potassium (VEETID) 500 MG tablet Take 1 tablet (500 mg total) by mouth 4 (four) times daily for 7 days. 28 tablet Raylene Everts, MD   HYDROcodone-acetaminophen [facility]) 7.5-325 MG tablet Take 1 tablet by mouth every 6 (six) hours as needed for moderate pain. 15 tablet Raylene Everts, MD     Controlled Substance Prescriptions [place] Controlled Substance Registry consulted? Yes, I have consulted the [place] Controlled Substances Registry for this patient, and feel the risk/benefit ratio today is favorable for proceeding with this prescription for a controlled substance.   Raylene Everts, MD 04/01/19 1357

## 2019-04-01 NOTE — Discharge Instructions (Addendum)
Take the penicillin 4 times a day as prescribed Take ibuprofen for mild to moderate pain Take hydrocodone for severe pain.  Do not drive on hydrocodone. Call your dentist on Monday.  You need to be seen within the next week or 21

## 2019-04-01 NOTE — ED Triage Notes (Signed)
Pt presents with right side dental pain because of wisdom teeth coming in; pt has head constant pain since yesterday even with OTC pain reliever.

## 2020-02-27 ENCOUNTER — Ambulatory Visit (HOSPITAL_COMMUNITY)
Admission: EM | Admit: 2020-02-27 | Discharge: 2020-02-27 | Disposition: A | Discharge status: Home / Self Care | Payer: Self-pay | Attending: Urgent Care | Admitting: Urgent Care

## 2020-02-27 ENCOUNTER — Encounter (HOSPITAL_COMMUNITY): Payer: Self-pay | Admitting: *Deleted

## 2020-02-27 ENCOUNTER — Other Ambulatory Visit: Payer: Self-pay

## 2020-02-27 DIAGNOSIS — Z3202 Encounter for pregnancy test, result negative: Secondary | ICD-10-CM

## 2020-02-27 DIAGNOSIS — R319 Hematuria, unspecified: Secondary | ICD-10-CM | POA: Insufficient documentation

## 2020-02-27 LAB — POCT URINALYSIS DIP (DEVICE)
Bilirubin Urine: NEGATIVE
Glucose, UA: NEGATIVE mg/dL
Hgb urine dipstick: NEGATIVE
Ketones, ur: NEGATIVE mg/dL
Leukocytes,Ua: NEGATIVE
Nitrite: NEGATIVE
Protein, ur: NEGATIVE mg/dL
Specific Gravity, Urine: 1.02 (ref 1.005–1.030)
Urobilinogen, UA: 1 mg/dL (ref 0.0–1.0)
pH: 7 (ref 5.0–8.0)

## 2020-02-27 LAB — BASIC METABOLIC PANEL
Anion gap: 8 (ref 5–15)
BUN: 8 mg/dL (ref 6–20)
CO2: 20 mmol/L — ABNORMAL LOW (ref 22–32)
Calcium: 8.8 mg/dL — ABNORMAL LOW (ref 8.9–10.3)
Chloride: 107 mmol/L (ref 98–111)
Creatinine, Ser: 0.68 mg/dL (ref 0.44–1.00)
GFR calc Af Amer: >60 mL/min (ref >60)
GFR calc non Af Amer: >60 mL/min (ref >60)
Glucose, Bld: 75 mg/dL (ref 70–99)
Potassium: 3.9 mmol/L (ref 3.5–5.1)
Sodium: 135 mmol/L (ref 135–145)

## 2020-02-27 LAB — POC URINE PREG, ED: Preg Test, Ur: NEGATIVE

## 2020-02-27 NOTE — ED Provider Notes (Signed)
[facility]   MRN: [id number] DOB: Dec 04, 1985  Subjective:   Tracy Hooper is a 34 y.o. female presenting for 4-week history of persistent hematuria, red urine.  Patient states that she thought this was due to an over-the-counter hormonal medication that she was taking which she has since stopped.  She did go to the health department and states that she had a negative urine regnancy test, urinalysis, Pap smear.  She thinks that they tested for STIs but is not sure.  States that she has had some mild occasional abdominal pain in the lower area but is more chronic in nature.  She does not have a PCP, gynecologist.  Denies taking chronic medications.  No Known Allergies  History reviewed. No pertinent past medical history.   Past Surgical History:  Procedure Laterality Date   CESAREAN SECTION     EYE SURGERY      Family History  Family history unknown: Yes    Social History   Tobacco Use   Smoking status: Never Smoker   Smokeless tobacco: Never Used  Substance Use Topics   Alcohol use: No   Drug use: Yes    Types: Marijuana    ROS   Objective:   Vitals: BP 115/81 (BP Location: Left Arm)    Pulse 91    Temp 99 F (37.2 C) (Oral)    Resp 16    SpO2 100%   Physical Exam Constitutional:      General: She is not in acute distress.    Appearance: Normal appearance. She is well-developed. She is not ill-appearing, toxic-appearing or diaphoretic.  HENT:     Head: Normocephalic and atraumatic.     Nose: Nose normal.     Mouth/Throat:     Mouth: Mucous membranes are moist.     Pharynx: Oropharynx is clear.  Eyes:     General: No scleral icterus.       Right eye: No discharge.        Left eye: No discharge.     Extraocular Movements: Extraocular movements intact.     Conjunctiva/sclera: Conjunctivae normal.     Pupils: Pupils are equal, round, and reactive to light.  Cardiovascular:     Rate and Rhythm: Normal rate.  Pulmonary:     Effort:  Pulmonary effort is normal.  Abdominal:     General: Bowel sounds are normal. There is no distension.     Palpations: Abdomen is soft. There is no mass.     Tenderness: There is no abdominal tenderness. There is no right CVA tenderness, left CVA tenderness, guarding or rebound.  Skin:    General: Skin is warm and dry.  Neurological:     General: No focal deficit present.     Mental Status: She is alert and oriented to person, place, and time.  Psychiatric:        Mood and Affect: Mood normal.        Behavior: Behavior normal.        Thought Content: Thought content normal.        Judgment: Judgment normal.     Results for orders placed or performed during the hospital encounter of 02/27/20 (from the past 24 hour(s))  POCT urinalysis dip (device)     Status: None   Collection Time: 02/27/20  3:40 PM  Result Value Ref Range   Glucose, UA NEGATIVE NEGATIVE mg/dL   Bilirubin Urine NEGATIVE NEGATIVE   Ketones, ur NEGATIVE NEGATIVE mg/dL   Specific Gravity,  Urine 1.020 1.005 - 1.030   Hgb urine dipstick NEGATIVE NEGATIVE   pH 7.0 5.0 - 8.0   Protein, ur NEGATIVE NEGATIVE mg/dL   Urobilinogen, UA 1.0 0.0 - 1.0 mg/dL   Nitrite NEGATIVE NEGATIVE   Leukocytes,Ua NEGATIVE NEGATIVE  POC urine pregnancy     Status: None   Collection Time: 02/27/20  3:45 PM  Result Value Ref Range   Preg Test, Ur NEGATIVE NEGATIVE    Assessment and Plan :   PDMP not reviewed this encounter.  1. Hematuria, unspecified type     No evidence of hematuria on urinalysis, physical exam findings and vital signs reassuring.  Counseled patient that we should check for STIs to be sure, she was agreeable.  Recommended holding off on any over-the-counter supplements and hormonal medications.  Counseled on general health maintenance.  Follow-up and establish care with new PCP through [facility] internal medicine.  Will report results through MyChart. Counseled patient on potential for adverse effects with medications  prescribed/recommended today, ER and return-to-clinic precautions discussed, patient verbalized understanding.    Wallis Bamberg, [place] 02/27/20 [phone]

## 2020-02-27 NOTE — ED Triage Notes (Signed)
Patient reports hematuria x approx 4 weeks. Went to health department and had pap smear, ua, ua preg and blood drawn x 1.5 weeks. States was told that everything was negative for UTI. Was taken vitamins has stopped that and increased water intake. Reports lower abdominal pain mild discomfort, mild nausea but states that is not abnormal.

## 2020-02-28 LAB — CERVICOVAGINAL ANCILLARY ONLY
Bacterial Vaginitis (gardnerella): NEGATIVE
Chlamydia: NEGATIVE
Comment: NEGATIVE
Comment: NEGATIVE
Comment: NEGATIVE
Comment: NORMAL
Neisseria Gonorrhea: NEGATIVE
Trichomonas: NEGATIVE

## 2020-11-15 ENCOUNTER — Encounter (HOSPITAL_COMMUNITY): Payer: Self-pay | Admitting: Emergency Medicine

## 2020-11-15 ENCOUNTER — Other Ambulatory Visit: Payer: Self-pay

## 2020-11-15 ENCOUNTER — Emergency Department (HOSPITAL_COMMUNITY)
Admission: EM | Admit: 2020-11-15 | Discharge: 2020-11-15 | Disposition: A | Discharge status: Home / Self Care | Payer: Self-pay | Attending: Emergency Medicine | Admitting: Emergency Medicine

## 2020-11-15 ENCOUNTER — Emergency Department (HOSPITAL_COMMUNITY): Payer: Self-pay

## 2020-11-15 DIAGNOSIS — M542 Cervicalgia: Secondary | ICD-10-CM | POA: Insufficient documentation

## 2020-11-15 DIAGNOSIS — Y92410 Unspecified street and highway as the place of occurrence of the external cause: Secondary | ICD-10-CM | POA: Insufficient documentation

## 2020-11-15 DIAGNOSIS — M545 Low back pain, unspecified: Secondary | ICD-10-CM | POA: Insufficient documentation

## 2020-11-15 MED ORDER — OXYCODONE-ACETAMINOPHEN 5-325 MG PO TABS
2.0000 | ORAL_TABLET | Freq: Once | ORAL | Status: AC
  Administered 2020-11-15: 2 via ORAL
  Filled 2020-11-15: qty 2

## 2020-11-15 NOTE — ED Notes (Signed)
Patient Alert and oriented to baseline. Stable and ambulatory to baseline. Patient verbalized understanding of the discharge instructions.  Patient belongings were taken by the patient.   

## 2020-11-15 NOTE — Discharge Instructions (Addendum)
No fractures are noted. Please use ibuprofen and Tylenol and cold therapy as needed for pain. Gentle movement of all extremities helps with muscular pain Return to the emergency department if you are having worsening of symptoms especially sudden severe pain, shortness of breath, or altered mental status.
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
## 2021-01-10 ENCOUNTER — Encounter (HOSPITAL_COMMUNITY): Payer: Self-pay | Admitting: Emergency Medicine

## 2021-01-10 ENCOUNTER — Emergency Department (HOSPITAL_COMMUNITY): Payer: Medicaid Other

## 2021-01-10 ENCOUNTER — Emergency Department (HOSPITAL_COMMUNITY)
Admission: EM | Admit: 2021-01-10 | Discharge: 2021-01-11 | Disposition: A | Discharge status: Home / Self Care | Payer: Medicaid Other | Attending: Emergency Medicine | Admitting: Emergency Medicine

## 2021-01-10 ENCOUNTER — Encounter (HOSPITAL_COMMUNITY): Payer: Self-pay

## 2021-01-10 ENCOUNTER — Ambulatory Visit (HOSPITAL_COMMUNITY)
Admission: EM | Admit: 2021-01-10 | Discharge: 2021-01-10 | Disposition: A | Discharge status: Home / Self Care | Payer: Medicaid Other | Attending: Urgent Care | Admitting: Urgent Care

## 2021-01-10 DIAGNOSIS — R112 Nausea with vomiting, unspecified: Secondary | ICD-10-CM

## 2021-01-10 DIAGNOSIS — M25512 Pain in left shoulder: Secondary | ICD-10-CM

## 2021-01-10 DIAGNOSIS — E876 Hypokalemia: Secondary | ICD-10-CM | POA: Insufficient documentation

## 2021-01-10 DIAGNOSIS — R1012 Left upper quadrant pain: Secondary | ICD-10-CM | POA: Insufficient documentation

## 2021-01-10 DIAGNOSIS — R109 Unspecified abdominal pain: Secondary | ICD-10-CM

## 2021-01-10 DIAGNOSIS — M542 Cervicalgia: Secondary | ICD-10-CM

## 2021-01-10 DIAGNOSIS — K5900 Constipation, unspecified: Secondary | ICD-10-CM | POA: Insufficient documentation

## 2021-01-10 LAB — URINALYSIS, ROUTINE W REFLEX MICROSCOPIC
Bilirubin Urine: NEGATIVE
Glucose, UA: NEGATIVE mg/dL
Hgb urine dipstick: NEGATIVE
Ketones, ur: NEGATIVE mg/dL
Leukocytes,Ua: NEGATIVE
Nitrite: NEGATIVE
Protein, ur: NEGATIVE mg/dL
Specific Gravity, Urine: 1.015 (ref 1.005–1.030)
pH: 6 (ref 5.0–8.0)

## 2021-01-10 LAB — POCT URINALYSIS DIPSTICK, ED / UC
Bilirubin Urine: NEGATIVE
Glucose, UA: NEGATIVE mg/dL
Hgb urine dipstick: NEGATIVE
Ketones, ur: NEGATIVE mg/dL
Leukocytes,Ua: NEGATIVE
Nitrite: NEGATIVE
Protein, ur: NEGATIVE mg/dL
Specific Gravity, Urine: 1.015 (ref 1.005–1.030)
Urobilinogen, UA: 0.2 mg/dL (ref 0.0–1.0)
pH: 6.5 (ref 5.0–8.0)

## 2021-01-10 LAB — CBC WITH DIFFERENTIAL/PLATELET
Abs Immature Granulocytes: 0.01 10*3/uL (ref 0.00–0.07)
Basophils Absolute: 0 10*3/uL (ref 0.0–0.1)
Basophils Relative: 0 %
Eosinophils Absolute: 0.1 10*3/uL (ref 0.0–0.5)
Eosinophils Relative: 2 %
HCT: 40.2 % (ref 36.0–46.0)
Hemoglobin: 13.5 g/dL (ref 12.0–15.0)
Immature Granulocytes: 0 %
Lymphocytes Relative: 46 %
Lymphs Abs: 2.5 10*3/uL (ref 0.7–4.0)
MCH: 32.4 pg (ref 26.0–34.0)
MCHC: 33.6 g/dL (ref 30.0–36.0)
MCV: 96.4 fL (ref 80.0–100.0)
Monocytes Absolute: 0.5 10*3/uL (ref 0.1–1.0)
Monocytes Relative: 8 %
Neutro Abs: 2.4 10*3/uL (ref 1.7–7.7)
Neutrophils Relative %: 44 %
Platelets: 162 10*3/uL (ref 150–400)
RBC: 4.17 MIL/uL (ref 3.87–5.11)
RDW: 11.3 % — ABNORMAL LOW (ref 11.5–15.5)
WBC: 5.5 10*3/uL (ref 4.0–10.5)
nRBC: 0 % (ref 0.0–0.2)

## 2021-01-10 LAB — COMPREHENSIVE METABOLIC PANEL
ALT: 170 U/L — ABNORMAL HIGH (ref 0–44)
AST: 78 U/L — ABNORMAL HIGH (ref 15–41)
Albumin: 3.3 g/dL — ABNORMAL LOW (ref 3.5–5.0)
Alkaline Phosphatase: 60 U/L (ref 38–126)
Anion gap: 7 (ref 5–15)
BUN: <5 mg/dL — ABNORMAL LOW (ref 6–20)
CO2: 21 mmol/L — ABNORMAL LOW (ref 22–32)
Calcium: 9 mg/dL (ref 8.9–10.3)
Chloride: 107 mmol/L (ref 98–111)
Creatinine, Ser: 0.66 mg/dL (ref 0.44–1.00)
GFR, Estimated: >60 mL/min (ref >60)
Glucose, Bld: 87 mg/dL (ref 70–99)
Potassium: 3.2 mmol/L — ABNORMAL LOW (ref 3.5–5.1)
Sodium: 135 mmol/L (ref 135–145)
Total Bilirubin: 0.6 mg/dL (ref 0.3–1.2)
Total Protein: 7.3 g/dL (ref 6.5–8.1)

## 2021-01-10 LAB — LIPASE, BLOOD: Lipase: 24 U/L (ref 11–51)

## 2021-01-10 LAB — I-STAT BETA HCG BLOOD, ED (MC, WL, AP ONLY): I-stat hCG, quantitative: <5 m[IU]/mL (ref <5)

## 2021-01-10 LAB — POC URINE PREG, ED: Preg Test, Ur: NEGATIVE

## 2021-01-10 LAB — TROPONIN I (HIGH SENSITIVITY): Troponin I (High Sensitivity): 3 ng/L (ref <18)

## 2021-01-10 MED ORDER — KETOROLAC TROMETHAMINE 60 MG/2ML IM SOLN
INTRAMUSCULAR | Status: AC
  Filled 2021-01-10: qty 2

## 2021-01-10 MED ORDER — KETOROLAC TROMETHAMINE 60 MG/2ML IM SOLN
60.0000 mg | Freq: Once | INTRAMUSCULAR | Status: AC
  Administered 2021-01-10: 60 mg via INTRAMUSCULAR

## 2021-01-10 MED ORDER — IOHEXOL 350 MG/ML SOLN
100.0000 mL | Freq: Once | INTRAVENOUS | Status: AC | PRN
  Administered 2021-01-10: 100 mL via INTRAVENOUS
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
## 2021-01-10 NOTE — ED Triage Notes (Signed)
Pt endorses LLQ pain and left shoulder pain. Last BM today. States she cannot take a full breath due to pain is worse in shoulder. Pt given pain shot at [facility]

## 2021-01-10 NOTE — ED Notes (Signed)
Patient transported to X-ray 

## 2021-01-10 NOTE — ED Provider Notes (Signed)
[facility]   MRN: [id number] DOB: 30-Jul-1986  Subjective:   Tracy Hooper is a 35 y.o. female presenting for 2-day history of acute onset persistent and severe worsening 10 out of 10 upper epigastric to left upper quadrant abdominal pain that is radiating to her left shoulder.  Patient states that the pain is unrelenting, has not taken any pain medications.  States that she did take a laxative but was not constipated.  Symptoms did not improve with this but they were already bad anyway.  Has also had nausea and one episode of vomiting.  Denies any history of GI issues including Crohn's, ulcerative colitis, IBS.  Denies dysuria, urinary frequency, vaginal discharge, pelvic pain.  No falls, trauma.  No current facility-administered medications for this encounter.  Current Outpatient Medications:    diphenhydramine-acetaminophen (TYLENOL PM) 25-500 MG TABS tablet, Take 1 tablet by mouth at bedtime as needed., Disp: , Rfl:    Multiple Vitamin (MULTIVITAMIN) tablet, Take 1 tablet by mouth daily., Disp: , Rfl:    No Known Allergies  History reviewed. No pertinent past medical history.   Past Surgical History:  Procedure Laterality Date   CESAREAN SECTION     EYE SURGERY      Family History  Family history unknown: Yes    Social History   Tobacco Use   Smoking status: Never   Smokeless tobacco: Never  Substance Use Topics   Alcohol use: No   Drug use: Yes    Types: Marijuana    ROS   Objective:   Vitals: BP (!) 133/92 (BP Location: Right Arm)   Pulse 93   Temp 98.6 F (37 C) (Oral)   Resp 17   SpO2 99%   Physical Exam Constitutional:      General: She is not in acute distress.    Appearance: Normal appearance. She is well-developed and normal weight. She is not ill-appearing, toxic-appearing or diaphoretic.  HENT:     Head: Normocephalic and atraumatic.     Right Ear: External ear normal.     Left Ear: External ear normal.     Nose: Nose  normal.     Mouth/Throat:     Mouth: Mucous membranes are moist.     Pharynx: Oropharynx is clear.  Eyes:     General: No scleral icterus.    Extraocular Movements: Extraocular movements intact.     Pupils: Pupils are equal, round, and reactive to light.  Cardiovascular:     Rate and Rhythm: Normal rate and regular rhythm.     Pulses: Normal pulses.     Heart sounds: Normal heart sounds. No murmur heard.   No friction rub. No gallop.  Pulmonary:     Effort: Pulmonary effort is normal. No respiratory distress.     Breath sounds: Normal breath sounds. No stridor. No wheezing, rhonchi or rales.  Abdominal:     General: Bowel sounds are normal. There is no distension.     Palpations: Abdomen is soft. There is no mass.     Tenderness: There is generalized abdominal tenderness and tenderness in the epigastric area and left upper quadrant. There is guarding. There is no right CVA tenderness, left CVA tenderness or rebound.  Skin:    General: Skin is warm and dry.     Coloration: Skin is not pale.     Findings: No rash.  Neurological:     General: No focal deficit present.     Mental Status: She is  alert and oriented to person, place, and time.  Psychiatric:        Mood and Affect: Mood normal.        Behavior: Behavior normal.        Thought Content: Thought content normal.        Judgment: Judgment normal.    Results for orders placed or performed during the hospital encounter of 01/10/21 (from the past 24 hour(s))  POC Urinalysis dipstick     Status: None   Collection Time: 01/10/21  1:38 PM  Result Value Ref Range   Glucose, UA NEGATIVE NEGATIVE mg/dL   Bilirubin Urine NEGATIVE NEGATIVE   Ketones, ur NEGATIVE NEGATIVE mg/dL   Specific Gravity, Urine 1.015 1.005 - 1.030   Hgb urine dipstick NEGATIVE NEGATIVE   pH 6.5 5.0 - 8.0   Protein, ur NEGATIVE NEGATIVE mg/dL   Urobilinogen, UA 0.2 0.0 - 1.0 mg/dL   Nitrite NEGATIVE NEGATIVE   Leukocytes,Ua NEGATIVE NEGATIVE  POC urine  pregnancy     Status: None   Collection Time: 01/10/21  1:41 PM  Result Value Ref Range   Preg Test, Ur NEGATIVE NEGATIVE    Assessment and Plan :   PDMP not reviewed this encounter.  1. Continuous severe abdominal pain   2. Non-intractable vomiting with nausea, unspecified vomiting type   3. Left upper quadrant abdominal pain   4. Acute pain of left shoulder   5. Neck pain     Patient has severe continuous intractable abdominal pain.  Recommended further evaluation in the emergency room and consideration for CT scan for rule out of acute abdomen.  IM Toradol given in clinic.  Patient contracts for safety and will present to the emergency room by personal vehicle.   Wallis Bamberg, PA-C 01/10/21 1402

## 2021-01-10 NOTE — ED Provider Notes (Signed)
Emergency Medicine Provider Triage Evaluation Note  Tracy Hooper , a 35 y.o. female  was evaluated in triage.  Pt complains of left upper abdominal pain, nausea, vomiting. Also has pain in left trapezius/shoulder that is worse with deep breathing. No frank CP or SOB just pain with breathing in left trapezius area.   Review of Systems  Positive: Abd pain left shoulder/trap pain, n/v Negative: Fever   Physical Exam  BP (!) 133/95 (BP Location: Left Arm)   Pulse 82   Temp 98.2 F (36.8 C) (Oral)   Resp 18   SpO2 97%  Gen:   Awake, no distress   Resp:  Normal effort  MSK:   Moves extremities without difficulty  Abd:  LUQ tenderness  Medical Decision Making  Medically screening exam initiated at 2:40 PM.  Appropriate orders placed.    Patient made aware this encounter is a triage and screening encounter and no beds are immediately available at this time in the ER.  Patient was informed that the remainder of the evaluation will be completed by another provider.  Patient made aware triage orders have been placed and patient will be placed in the waiting room while work up is initiated and until a room becomes available. Patient encouraged to await a formal ER encounter with a clinician.  Patient made aware that exiting the department prior to formal encounter with an ER clinician and completion of the work-up is considered leaving against medical advice.  At that time there is no guarantee that there are no emergency medical conditions present and patient assumes risks of leaving including worsening condition, permanent disability and death. Patient verbalizes understanding.     Liberty Handy, PA-C 01/10/21 1442    Benjiman Core, MD 01/10/21 1944

## 2021-01-10 NOTE — ED Triage Notes (Signed)
Pt in with c/o LUQ pain and left shoulder pain that started yesterday  Pt states she also has been vomiting  Pt states when she moves or take deep breath she feels sharp pain in shoulder

## 2021-01-10 NOTE — ED Notes (Signed)
Patient is being discharged from the Urgent Care and sent to the Emergency Department via POV . Per Marlowe Shores, PA, patient is in need of higher level of care due to severe abdominal pain. Patient is aware and verbalizes understanding of plan of care.  Vitals:   01/10/21 1327  BP: (!) 133/92  Pulse: 93  Resp: 17  Temp: 98.6 F (37 C)  SpO2: 99%

## 2021-01-10 NOTE — Discharge Instructions (Addendum)
Please report to the emergency room as you have continuous severe abdominal pain and are in need of a higher level of care than we can provide including lab work, CT abdomen/pelvis.

## 2021-01-11 MED ORDER — PANTOPRAZOLE SODIUM 20 MG PO TBEC: 20.0000 mg | DELAYED_RELEASE_TABLET | Freq: Every day | ORAL | 0 refills | Status: AC

## 2021-01-11 MED ORDER — LIDOCAINE VISCOUS HCL 2 % MT SOLN
15.0000 mL | Freq: Once | OROMUCOSAL | Status: AC
  Administered 2021-01-11: 15 mL via ORAL
  Filled 2021-01-11: qty 15

## 2021-01-11 MED ORDER — LIDOCAINE 5 % EX PTCH
1.0000 | MEDICATED_PATCH | CUTANEOUS | Status: DC
  Administered 2021-01-11: 1 via TRANSDERMAL
  Filled 2021-01-11: qty 1

## 2021-01-11 MED ORDER — POTASSIUM CHLORIDE CRYS ER 20 MEQ PO TBCR: 20.0000 meq | EXTENDED_RELEASE_TABLET | Freq: Every day | ORAL | 0 refills | Status: AC

## 2021-01-11 MED ORDER — ALUM & MAG HYDROXIDE-SIMETH 200-200-20 MG/5ML PO SUSP
30.0000 mL | Freq: Once | ORAL | Status: AC
  Administered 2021-01-11: 30 mL via ORAL
  Filled 2021-01-11: qty 30

## 2021-01-11 MED ORDER — POTASSIUM CHLORIDE CRYS ER 20 MEQ PO TBCR
40.0000 meq | EXTENDED_RELEASE_TABLET | Freq: Once | ORAL | Status: AC
  Administered 2021-01-11: 40 meq via ORAL
  Filled 2021-01-11: qty 2

## 2021-01-11 MED ORDER — ONDANSETRON HCL 4 MG/2ML IJ SOLN
4.0000 mg | Freq: Once | INTRAMUSCULAR | Status: AC
  Administered 2021-01-11: 4 mg via INTRAVENOUS
  Filled 2021-01-11: qty 2

## 2021-01-11 MED ORDER — SUCRALFATE 1 GM/10ML PO SUSP: 1.0000 g | Freq: Three times a day (TID) | ORAL | 0 refills | Status: AC

## 2021-01-11 MED ORDER — ONDANSETRON 4 MG PO TBDP: 4.0000 mg | ORAL_TABLET | Freq: Three times a day (TID) | ORAL | 0 refills | Status: DC | PRN

## 2021-01-11 MED ORDER — LIDOCAINE 5 % EX PTCH: 1.0000 | MEDICATED_PATCH | Freq: Every day | CUTANEOUS | 0 refills | Status: AC | PRN

## 2021-01-11 MED ORDER — METHOCARBAMOL 500 MG PO TABS: 500.0000 mg | ORAL_TABLET | Freq: Three times a day (TID) | ORAL | 0 refills | Status: DC | PRN

## 2021-01-11 NOTE — Discharge Instructions (Addendum)
You were seen in the emergency department today for abdominal pain and Upper back pain.  Your CT scan did show some thickening/inflammation in your intestines, we like you discussed with your primary care provider and/or a GI doctor as you may need a colonoscopy for further evaluation.  He also had some mildly elevated LFTs (liver function test), please avoid alcohol, please have this rechecked by primary care or GI.  We are sending you home with the following medications to help with your symptoms:  - Protonix- please take 1 tablet in the morning prior to any meals to help with stomach acidity/pain.  - Carafate- please take prior to each meal and prior to bedtime to help with stomach acidity/pain.  - Zofran- please take every 8 hours as needed for nausea/vomiting.  - Robaxin- this is a muscle relaxant-please take this every 8 hours as needed for her neck/upper back pain, this may make you sleepy therefore do not drive or operate heavy machinery after taking it.  Do not mix this with other sedating medicines or with alcohol. -Lidoderm patch: Please apply this patch to area with significant pain once per day.  Remove and discard patch within 12 hours of application.  Your potassium was also a bit low- please see attached diet guidelines and take the prescribed potassium supplement for the next 3 days.   We have prescribed you new medication(s) today. Discuss the medications prescribed today with your pharmacist as they can have adverse effects and interactions with your other medicines including over the counter and prescribed medications. Seek medical evaluation if you start to experience new or abnormal symptoms after taking one of these medicines, seek care immediately if you start to experience difficulty breathing, feeling of your throat closing, facial swelling, or rash as these could be indications of a more serious allergic reaction  Please follow attached diet guidelines.   Follow up with  your primary care provider within 3 days for re-evaluation.  Return to the ER for new or worsening symptoms including but not limited to worsened pain, new pain, inability to keep fluids down, blood in vomit/stool, passing out, coughing up blood, trouble breathing, or any other concerns.

## 2022-05-06 IMAGING — CT CT ABD-PELV W/ CM
2 of 5 series · 14 of 46 positions shown, 16 images · IV contrast (omnipaque)
Comparison: Radiograph 01/10/2021, 11/15/2020

CLINICAL DATA: Left upper quadrant abdominal pain and left shoulder
pain, difficulty with deep inspiration due to shoulder pain

EXAM:
CT ANGIOGRAPHY CHEST
CT ABDOMEN AND PELVIS WITH CONTRAST
TECHNIQUE: Multidetector CT imaging of the chest was performed using the
standard protocol during bolus administration of intravenous
contrast. Multiplanar CT image reconstructions and MIPs were
obtained to evaluate the vascular anatomy. Multidetector CT imaging
of the abdomen and pelvis was performed using the standard protocol
during bolus administration of intravenous contrast.
CONTRAST:  100mL OMNIPAQUE IOHEXOL 350 MG/ML SOLN

[Series 5: a/p w/ 5mm · axial · 0.84mm/px · z∈[-612,-206]mm · 11 of 91 slices shown, 13 images]
[im 5/91  soft-tissue]
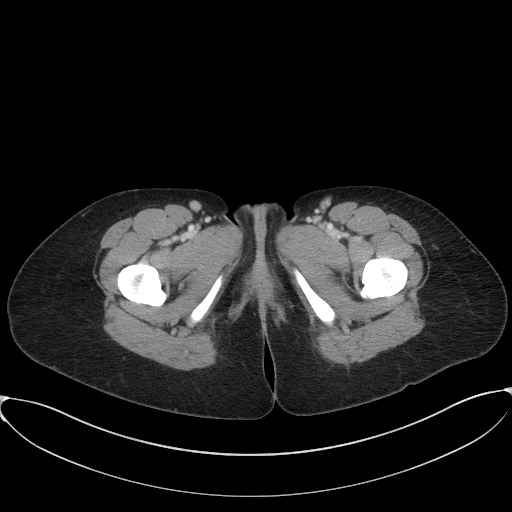
[im 5/91  bone]
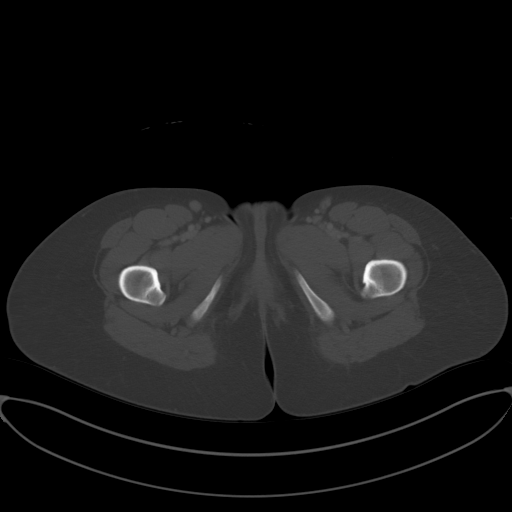
[im 15/91  soft-tissue]
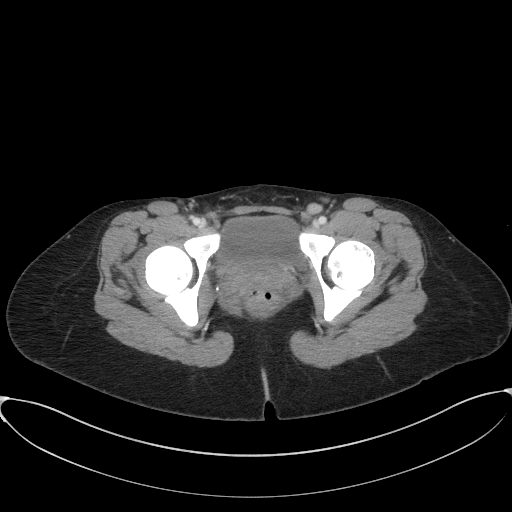
[im 24/91  soft-tissue]
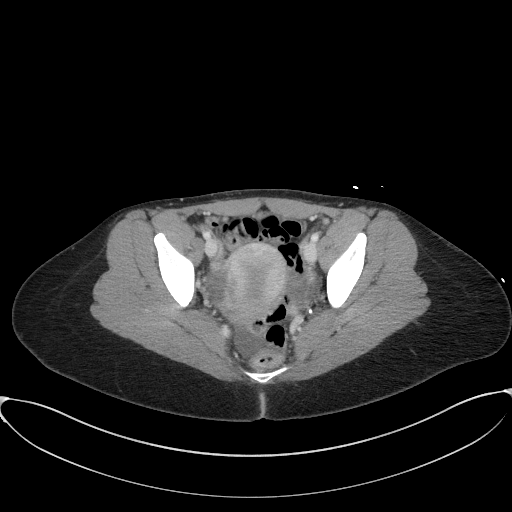
[im 29/91  soft-tissue]
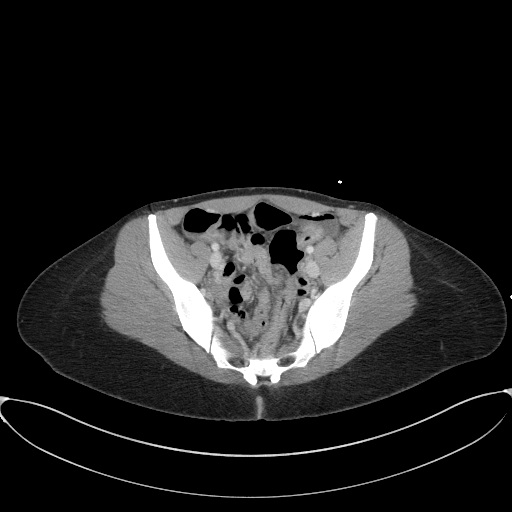
[im 38/91  soft-tissue]
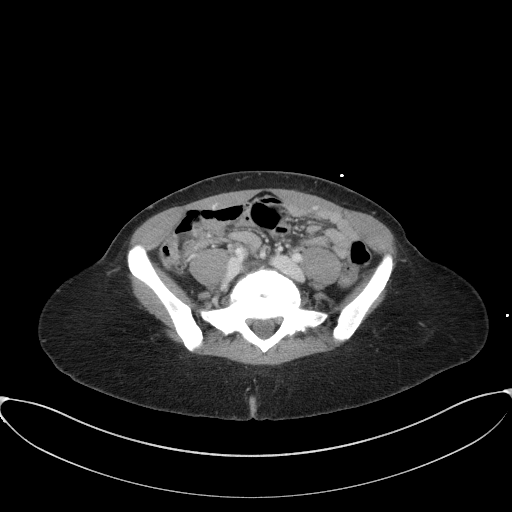
[im 48/91  soft-tissue]
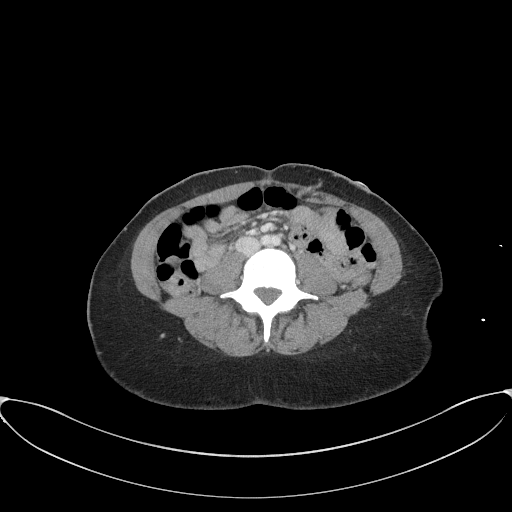
[im 53/91  soft-tissue]
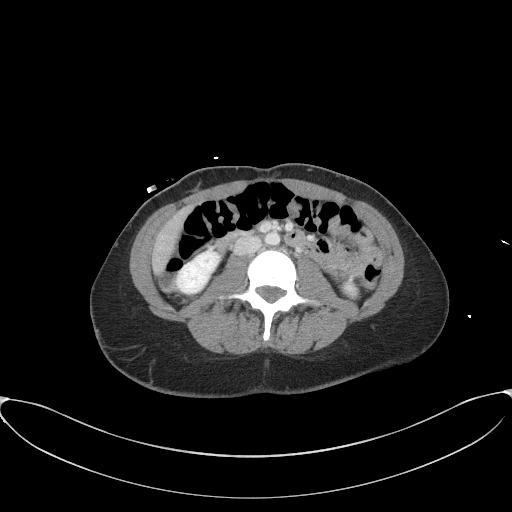
[im 62/91  soft-tissue]
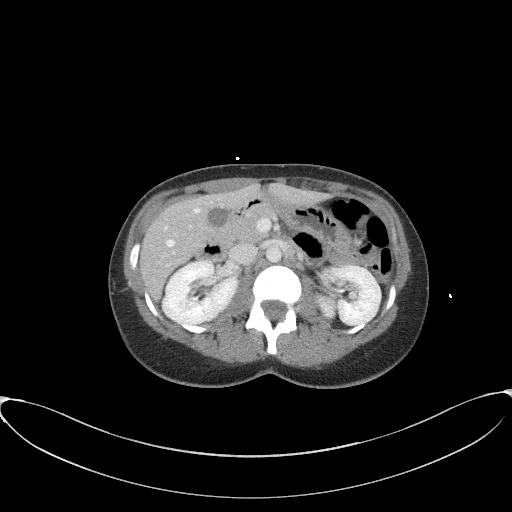
[im 67/91  soft-tissue]
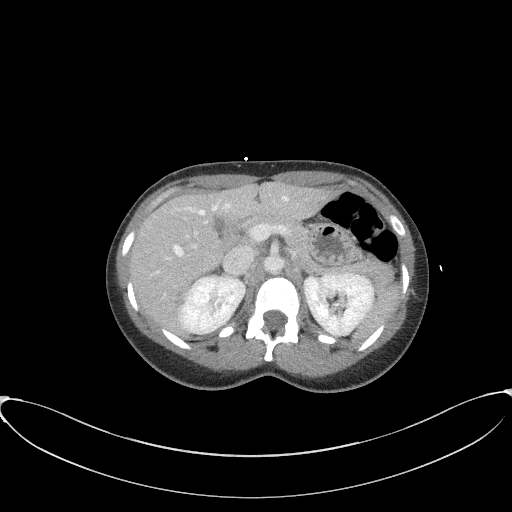
[im 67/91  bone]
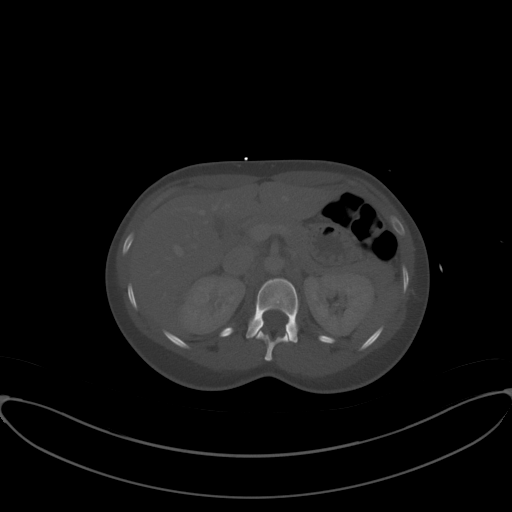
[im 76/91  soft-tissue]
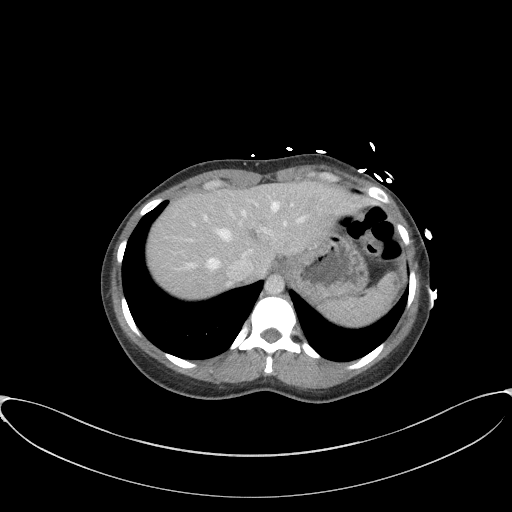
[im 86/91  soft-tissue]
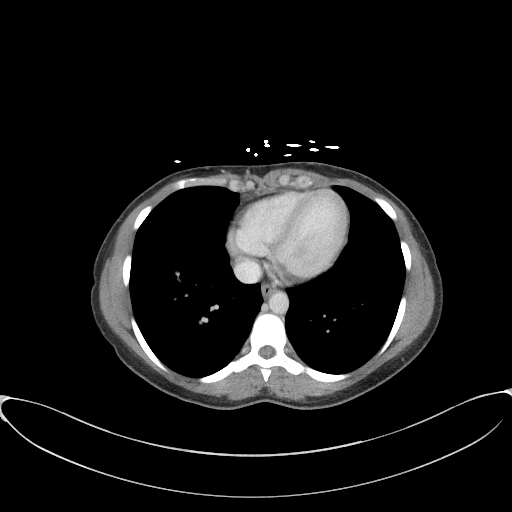

[Series 8: a/p w/ cor · coronal · 0.82mm/px · 3 of 148 slices shown]
[im 50/148  soft-tissue]
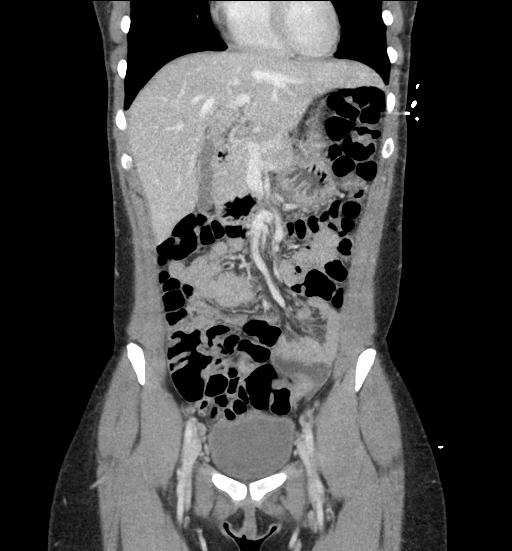
[im 66/148  soft-tissue]
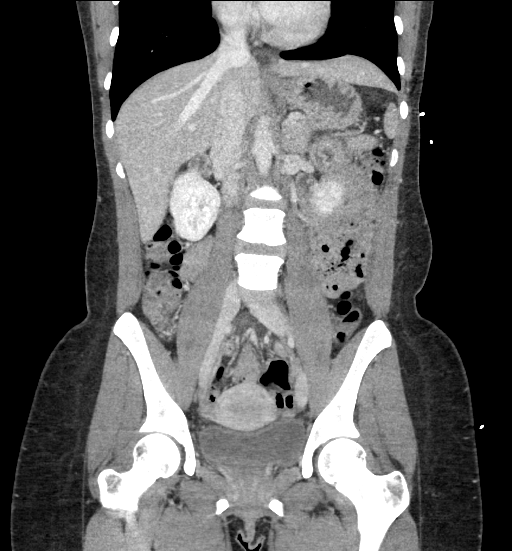
[im 82/148  soft-tissue]
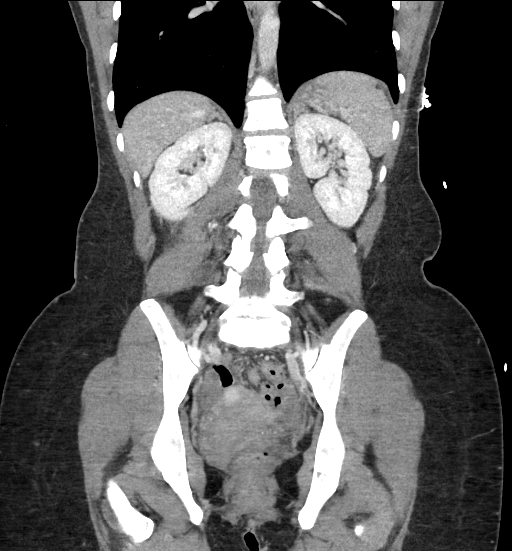

[14 of 46 positions shown; findings below may reference images not displayed]

FINDINGS: CTA CHEST FINDINGS

Cardiovascular: Satisfactory opacification the pulmonary arteries to
the segmental level. No pulmonary artery filling defects are
identified. Central pulmonary arteries are normal caliber. Normal
heart size. No pericardial effusion. The aorta is normal caliber. No
acute luminal abnormality of the imaged aorta. No periaortic
stranding or hemorrhage. Normal 3 vessel branching of the aortic
arch. Proximal great vessels are unremarkable. No major venous
abnormalities.

Mediastinum/Nodes: Wedge-shaped soft tissue attenuation across the
anterior mediastinum. No mediastinal fluid or gas. Normal thyroid
gland and thoracic inlet. No acute abnormality of the trachea or
esophagus. No worrisome mediastinal, hilar or axillary adenopathy.

Lungs/Pleura: No consolidation, features of edema, pneumothorax, or
effusion. No suspicious pulmonary nodules or masses.

Musculoskeletal: Mild midthoracic dextrocurvature with an apex at
the T6 level. No acute osseous abnormality or suspicious osseous
lesion. No worrisome chest wall masses or lesions.

Review of the MIP images confirms the above findings.

CT ABDOMEN and PELVIS FINDINGS

Hepatobiliary: Small subcentimeter hypoattenuating focus in the dome
of the right liver ([DATE]) too small to fully characterize on CT
imaging but statistically likely benign. No worrisome focal liver
lesions. Smooth liver surface contour. Normal hepatic attenuation.
Normal gallbladder and biliary tree

Pancreas: No pancreatic ductal dilatation or surrounding
inflammatory changes.

Spleen: Normal in size. No concerning splenic lesions.

Adrenals/Urinary Tract: Normal adrenal glands. Kidneys are normally
located with symmetric enhancement and excretion. No suspicious
renal lesion, urolithiasis or hydronephrosis. Question some mild
bladder wall thickening versus underdistention.

Stomach/Bowel: Distal esophagus, stomach and duodenal sweep are
unremarkable. There is some mild mural thickening and mucosal
hyperemia of the terminal ileum and cecum. No extraluminal gas,
organized collection or abscess. Remaining portions of both large
and small bowel are unremarkable. No evidence obstruction. Normal
appendix in the right lower quadrant.

Vascular/Lymphatic: No significant vascular findings are present. No
enlarged abdominal or pelvic lymph nodes.

Reproductive: Uterus and bilateral adnexa are unremarkable.

Other: Small volume of low-attenuation free fluid in the deep pelvis
is nonspecific in a reproductive age female, often physiologic. No
free intra-abdominopelvic air. No bowel containing hernia.

Musculoskeletal: No acute osseous abnormality or suspicious osseous
lesion.

Review of the MIP images confirms the above findings.
IMPRESSION: CT angiography chest:

1. No evidence of pulmonary artery embolism.
2. Wedge-shaped soft tissue attenuation in the anterior mediastinum
is most likely reflective of thymic remnant in a patient of this age
given location, configuration, and absence of surrounding fat
stranding or traumatic features.
3. Mild midthoracic dextrocurvature, apex T6.
4. No other acute or significant intrathoracic process.

CT abdomen and pelvis:

1. Mural thickening of the terminal ileum and cecum with some
mucosal hyperemia. Could reflect an inflammatory or infectious
enterocolitis.
2. Question some mild mural thickening of the urinary bladder
possibly related to underdistention though should correlate for
urinary symptoms and consider urinalysis were clinically warranted.
3. Small volume of low-attenuation free fluid in the deep pelvis,
nonspecific and often physiologic in a reproductive age female.

## 2023-09-06 ENCOUNTER — Other Ambulatory Visit: Payer: Self-pay | Admitting: Family Medicine

## 2023-09-06 ENCOUNTER — Ambulatory Visit: Payer: Self-pay

## 2023-09-06 DIAGNOSIS — Z021 Encounter for pre-employment examination: Secondary | ICD-10-CM

## 2023-10-13 ENCOUNTER — Ambulatory Visit
Admission: EM | Admit: 2023-10-13 | Discharge: 2023-10-13 | Disposition: A | Discharge status: Home / Self Care | Attending: Emergency Medicine | Admitting: Emergency Medicine

## 2023-10-13 DIAGNOSIS — J111 Influenza due to unidentified influenza virus with other respiratory manifestations: Secondary | ICD-10-CM | POA: Diagnosis not present

## 2023-10-13 LAB — POC COVID19/FLU A&B COMBO
Covid Antigen, POC: NEGATIVE
Influenza A Antigen, POC: NEGATIVE
Influenza B Antigen, POC: NEGATIVE

## 2023-10-13 MED ORDER — ONDANSETRON 4 MG PO TBDP: 4.0000 mg | ORAL_TABLET | Freq: Four times a day (QID) | ORAL | 0 refills | Status: AC | PRN

## 2023-10-13 MED ORDER — CYCLOBENZAPRINE HCL 10 MG PO TABS: 10.0000 mg | ORAL_TABLET | Freq: Two times a day (BID) | ORAL | 0 refills | Status: AC | PRN

## 2023-10-13 MED ORDER — IBUPROFEN 800 MG PO TABS: 800.0000 mg | ORAL_TABLET | Freq: Three times a day (TID) | ORAL | 0 refills | Status: AC

## 2023-10-13 MED ORDER — KETOROLAC TROMETHAMINE 30 MG/ML IJ SOLN
30.0000 mg | Freq: Once | INTRAMUSCULAR | Status: AC
  Administered 2023-10-13: 30 mg via INTRAMUSCULAR

## 2023-10-13 NOTE — ED Provider Notes (Signed)
 [facility]    CSN: [id number] Arrival date & time: 10/13/23  1112     History   Chief Complaint Chief Complaint  Patient presents with   Generalized Body Aches   Cough   Fever   Emesis    HPI Tracy Hooper is a 38 y.o. female.  Here with 1 day history of body aches, tactile fever, cough, and 1 episode of vomiting yesterday. No emesis today, has not tried fluids yet but kept down mucinex and nyquil. Sick contacts at work  History reviewed. No pertinent past medical history.  There are no active problems to display for this patient.   Past Surgical History:  Procedure Laterality Date   CESAREAN SECTION     EYE SURGERY      OB History   No obstetric history on file.      Home Medications    Prior to Admission medications   Medication Sig Start Date End Date Taking? Authorizing Provider  cyclobenzaprine (FLEXERIL) 10 MG tablet Take 1 tablet (10 mg total) by mouth 2 (two) times daily as needed for muscle spasms. 10/13/23  Yes Blyss Lugar, Lurena Joiner, PA-C  ibuprofen (ADVIL) 800 MG tablet Take 1 tablet (800 mg total) by mouth 3 (three) times daily. 10/13/23  Yes Dodi Leu, Lurena Joiner, PA-C  ondansetron (ZOFRAN-ODT) 4 MG disintegrating tablet Take 1 tablet (4 mg total) by mouth every 6 (six) hours as needed for nausea or vomiting. 10/13/23  Yes Desta Bujak, Lurena Joiner, PA-C  Bisacodyl (LAXATIVE PO) Take 2 tablets by mouth daily as needed (constipation).    [provider]  diphenhydramine-acetaminophen (TYLENOL PM) 25-500 MG TABS tablet Take 2 tablets by mouth at bedtime as needed (sleep).    [provider]  Levonorgestrel-Ethinyl Estradiol (SEASONIQUE) 0.15-0.03 &0.01 MG tablet Take 1 tablet by mouth daily.    [provider]  lidocaine (LIDODERM) 5 % Place 1 patch onto the skin daily as needed. Apply patch to area most significant pain once per day.  Remove and discard patch within 12 hours of application. 01/11/21   Petrucelli, Samantha R, PA-C  Multiple  Vitamin (MULTIVITAMIN) tablet Take 1 tablet by mouth daily.    [provider]  pantoprazole (PROTONIX) 20 MG tablet Take 1 tablet (20 mg total) by mouth daily. 01/11/21   Petrucelli, Samantha R, PA-C  potassium chloride SA (KLOR-CON) 20 MEQ tablet Take 1 tablet (20 mEq total) by mouth daily. 01/11/21   Petrucelli, Samantha R, PA-C  sucralfate (CARAFATE) 1 GM/10ML suspension Take 10 mLs (1 g total) by mouth 4 (four) times daily -  with meals and at bedtime. 01/11/21   Petrucelli, Pleas Koch, PA-C    Family History Family History  Family history unknown: Yes    Social History Social History   Tobacco Use   Smoking status: Never   Smokeless tobacco: Never  Substance Use Topics   Alcohol use: No   Drug use: Yes    Types: Marijuana     Allergies   Patient has no known allergies.   Review of Systems Review of Systems Per HPI  Physical Exam Triage Vital Signs ED Triage Vitals  Encounter Vitals Group     BP 10/13/23 1144 121/76     Systolic BP Percentile --      Diastolic BP Percentile --      Pulse Rate 10/13/23 1144 (!) 104     Resp 10/13/23 1144 20     Temp 10/13/23 1144 99.8 F (37.7 C)     Temp  Source 10/13/23 1144 Oral     SpO2 10/13/23 1144 96 %     Weight --      Height --      Head Circumference --      Peak Flow --      Pain Score 10/13/23 1143 6     Pain Loc --      Pain Education --      Exclude from Growth Chart --    No data found.  Updated Vital Signs BP 121/76 (BP Location: Left Arm)   Pulse (!) 104   Temp 99.8 F (37.7 C) (Oral)   Resp 20   LMP 09/26/2023 (Exact Date)   SpO2 96%    Physical Exam Vitals and nursing note reviewed.  Constitutional:      Appearance: She is ill-appearing. She is not toxic-appearing.  HENT:     Nose: No congestion or rhinorrhea.     Mouth/Throat:     Mouth: Mucous membranes are moist.     Pharynx: Oropharynx is clear. No posterior oropharyngeal erythema.  Eyes:     Conjunctiva/sclera: Conjunctivae  normal.  Cardiovascular:     Rate and Rhythm: Normal rate and regular rhythm.     Pulses: Normal pulses.     Heart sounds: Normal heart sounds.  Pulmonary:     Effort: Pulmonary effort is normal.     Breath sounds: Normal breath sounds.  Abdominal:     Palpations: Abdomen is soft.     Tenderness: There is no abdominal tenderness.  Musculoskeletal:     Cervical back: Normal range of motion.  Lymphadenopathy:     Cervical: No cervical adenopathy.  Skin:    General: Skin is warm and dry.     Comments: Hot to touch  Neurological:     Mental Status: She is alert and oriented to person, place, and time.     UC Treatments / Results  Labs (all labs ordered are listed, but only abnormal results are displayed) Labs Reviewed  POC COVID19/FLU A&B COMBO    EKG  Radiology No results found.  Procedures Procedures   Medications Ordered in UC Medications  ketorolac (TORADOL) 30 MG/ML injection 30 mg (30 mg Intramuscular Given 10/13/23 1405)    Initial Impression / Assessment and Plan / UC Course  I have reviewed the triage vital signs and the nursing notes.  Pertinent labs & imaging results that were available during my care of the patient were reviewed by me and considered in my medical decision making (see chart for details).  Low grade temp on arrival IM toradol given for aches Rapid flu A/B and covid are negative today Discussed likely viral etiology, symptomatic and supportive care. Sent muscle relaxer, ibuprofen, zofran. Increase fluids. Note for work provided  Final Clinical Impressions(s) / UC Diagnoses   Final diagnoses:  Influenza-like illness     Discharge Instructions      Your flu and covid tests were negative - which could mean it is too early to detect. You likely have a flu-like virus.  The toradol injection given today should last several hours. Do not use any ibuprofen/Advil for the rest of today. You can safely use Tylenol.  Flexeril is a muscle  relaxer that can be used twice daily.  This may help with your body aches.  Please note it can make you drowsy  I have sent Zofran to use for nausea if needed.  1 tablet dissolves under the tongue every 6 hours.  Make sure  you are drinking lots of fluids. Try to get rest as much as you can     ED Prescriptions     Medication Sig Dispense Auth. Provider   ibuprofen (ADVIL) 800 MG tablet Take 1 tablet (800 mg total) by mouth 3 (three) times daily. 15 tablet Tomio Kirk, PA-C   cyclobenzaprine (FLEXERIL) 10 MG tablet Take 1 tablet (10 mg total) by mouth 2 (two) times daily as needed for muscle spasms. 20 tablet Jaylun Fleener, PA-C   ondansetron (ZOFRAN-ODT) 4 MG disintegrating tablet Take 1 tablet (4 mg total) by mouth every 6 (six) hours as needed for nausea or vomiting. 20 tablet Lyvonne Cassell, Lurena Joiner, PA-C      PDMP not reviewed this encounter.   Bennett Ram, Lurena Joiner, [place] 10/13/23 1427

## 2023-10-13 NOTE — Discharge Instructions (Addendum)
 Your flu and covid tests were negative - which could mean it is too early to detect. You likely have a flu-like virus.  The toradol injection given today should last several hours. Do not use any ibuprofen/Advil for the rest of today. You can safely use Tylenol.  Flexeril is a muscle relaxer that can be used twice daily.  This may help with your body aches.  Please note it can make you drowsy  I have sent Zofran to use for nausea if needed.  1 tablet dissolves under the tongue every 6 hours.  Make sure you are drinking lots of fluids. Try to get rest as much as you can

## 2023-10-13 NOTE — ED Triage Notes (Signed)
 Pt presents with c/o body aches and c/o pain when sitting down. Pt states she feels sore from the hips down. C/o intermittent fever, vomiting and cough.

## 2024-07-13 ENCOUNTER — Other Ambulatory Visit: Payer: Self-pay

## 2024-07-13 ENCOUNTER — Ambulatory Visit
Admission: EM | Admit: 2024-07-13 | Discharge: 2024-07-13 | Disposition: A | Discharge status: Home / Self Care | Attending: Family Medicine | Admitting: Family Medicine

## 2024-07-13 DIAGNOSIS — B3731 Acute candidiasis of vulva and vagina: Secondary | ICD-10-CM

## 2024-07-13 DIAGNOSIS — Z202 Contact with and (suspected) exposure to infections with a predominantly sexual mode of transmission: Secondary | ICD-10-CM | POA: Diagnosis not present

## 2024-07-13 DIAGNOSIS — R300 Dysuria: Secondary | ICD-10-CM

## 2024-07-13 DIAGNOSIS — Z3200 Encounter for pregnancy test, result unknown: Secondary | ICD-10-CM

## 2024-07-13 LAB — POCT URINE DIPSTICK
Bilirubin, UA: NEGATIVE
Blood, UA: NEGATIVE
Glucose, UA: NEGATIVE mg/dL
Ketones, POC UA: NEGATIVE mg/dL
Leukocytes, UA: NEGATIVE
Nitrite, UA: NEGATIVE
POC PROTEIN,UA: NEGATIVE
Spec Grav, UA: 1.025 (ref 1.010–1.025)
Urobilinogen, UA: 1 U/dL
pH, UA: 6.5 (ref 5.0–8.0)

## 2024-07-13 LAB — POCT URINE PREGNANCY: Preg Test, Ur: NEGATIVE

## 2024-07-13 MED ORDER — FLUCONAZOLE 200 MG PO TABS: ORAL_TABLET | ORAL | 0 refills | Status: AC

## 2024-07-13 NOTE — ED Triage Notes (Signed)
 Pt presents with complaints of vaginal irritation + itching x 2 months. No pain, just voices discomfort. States there is a foul, unusual odor with urination. Little vaginal discharge that is thin. Has tried to take AZO tablets + a supplement to help support urinary tract, not helping.

## 2024-07-13 NOTE — Discharge Instructions (Addendum)
 Advised patient take medication as directed.  Encouraged increase daily water intake to 64 ounces per day while taking this medication.  Advised if symptoms worsen and/or unresolved please follow-up with PCP or here for further evaluation advised patient we will follow-up with [facility] swab results once received.  Advised if symptoms worsen and are unresolved please follow-up with your PCP, GYN, or here for further evaluation.

## 2024-07-13 NOTE — ED Provider Notes (Signed)
 [facility]    CSN: [id number] Arrival date & time: 07/13/24  1837      History   Chief Complaint Chief Complaint  Patient presents with   Vaginal Irritation    Vaginal Itching    HPI AMALA PETION is a 38 y.o. female.   HPI 38 year old female presents with vaginal itching/irritation for 2 months.  Patient reports foul odor when urinating.   History reviewed. No pertinent past medical history.  There are no active problems to display for this patient.   Past Surgical History:  Procedure Laterality Date   CESAREAN SECTION     EYE SURGERY      OB History   No obstetric history on file.      Home Medications    Prior to Admission medications  Medication Sig Start Date End Date Taking? Authorizing Provider  fluconazole  (DIFLUCAN ) 200 MG tablet Take 1 tablet p.o. now may repeat 1 tablet p.o. in 3 days if symptoms or not resolved. 07/13/24  Yes Youcef Klas, FNP  Bisacodyl (LAXATIVE PO) Take 2 tablets by mouth daily as needed (constipation).    [provider]  cyclobenzaprine  (FLEXERIL ) 10 MG tablet Take 1 tablet (10 mg total) by mouth 2 (two) times daily as needed for muscle spasms. 10/13/23   Rising, Asberry, PA-C  diphenhydramine-acetaminophen  (TYLENOL  PM) 25-500 MG TABS tablet Take 2 tablets by mouth at bedtime as needed (sleep).    [provider]  ibuprofen  (ADVIL ) 800 MG tablet Take 1 tablet (800 mg total) by mouth 3 (three) times daily. 10/13/23   Rising, Asberry, PA-C  lidocaine  (LIDODERM ) 5 % Place 1 patch onto the skin daily as needed. Apply patch to area most significant pain once per day.  Remove and discard patch within 12 hours of application. 01/11/21   Petrucelli, Samantha R, PA-C  Multiple Vitamin (MULTIVITAMIN) tablet Take 1 tablet by mouth daily.    [provider]  ondansetron  (ZOFRAN -ODT) 4 MG disintegrating tablet Take 1 tablet (4 mg total) by mouth every 6 (six) hours as needed for nausea or vomiting.  10/13/23   Rising, Asberry, PA-C  pantoprazole  (PROTONIX ) 20 MG tablet Take 1 tablet (20 mg total) by mouth daily. 01/11/21   Petrucelli, Samantha R, PA-C  potassium chloride  SA (KLOR-CON ) 20 MEQ tablet Take 1 tablet (20 mEq total) by mouth daily. 01/11/21   Petrucelli, Samantha R, PA-C  sucralfate  (CARAFATE ) 1 GM/10ML suspension Take 10 mLs (1 g total) by mouth 4 (four) times daily -  with meals and at bedtime. 01/11/21   Petrucelli, Lucie JONELLE, PA-C    Family History Family History  Family history unknown: Yes    Social History Social History[1]   Allergies   Patient has no known allergies.   Review of Systems Review of Systems  Genitourinary:  Positive for frequency and urgency.  All other systems reviewed and are negative.    Physical Exam Triage Vital Signs ED Triage Vitals [07/13/24 1859]  Encounter Vitals Group     BP 123/84     Girls Systolic BP Percentile      Girls Diastolic BP Percentile      Boys Systolic BP Percentile      Boys Diastolic BP Percentile      Pulse Rate 76     Resp 16     Temp 98 F (36.7 C)     Temp Source Oral     SpO2 96 %     Weight 135 lb (61.2 kg)  Height 5' 5 (1.651 m)     Head Circumference      Peak Flow      Pain Score      Pain Loc      Pain Education      Exclude from Growth Chart    No data found.  Updated Vital Signs BP 123/84 (BP Location: Right Arm)   Pulse 76   Temp 98 F (36.7 C) (Oral)   Resp 16   Ht 5' 5 (1.651 m)   Wt 135 lb (61.2 kg)   LMP 07/04/2024 (Exact Date)   SpO2 96%   BMI 22.47 kg/m      Physical Exam Vitals and nursing note reviewed.  Constitutional:      Appearance: Normal appearance. She is normal weight.  HENT:     Head: Normocephalic and atraumatic.     Mouth/Throat:     Mouth: Mucous membranes are moist.     Pharynx: Oropharynx is clear.  Eyes:     Extraocular Movements: Extraocular movements intact.     Conjunctiva/sclera: Conjunctivae normal.     Pupils: Pupils are equal,  round, and reactive to light.  Cardiovascular:     Rate and Rhythm: Normal rate and regular rhythm.     Heart sounds: Normal heart sounds.  Pulmonary:     Effort: Pulmonary effort is normal.     Breath sounds: Normal breath sounds. No wheezing, rhonchi or rales.  Abdominal:     Tenderness: There is no right CVA tenderness or left CVA tenderness.  Musculoskeletal:        General: Normal range of motion.  Skin:    General: Skin is warm and dry.  Neurological:     General: No focal deficit present.     Mental Status: She is alert and oriented to person, place, and time. Mental status is at baseline.  Psychiatric:        Mood and Affect: Mood normal.        Behavior: Behavior normal.      UC Treatments / Results  Labs (all labs ordered are listed, but only abnormal results are displayed) Labs Reviewed  POCT URINE DIPSTICK  POCT URINE PREGNANCY  CERVICOVAGINAL ANCILLARY ONLY    EKG   Radiology No results found.  Procedures Procedures (including critical care time)  Medications Ordered in UC Medications - No data to display  Initial Impression / Assessment and Plan / UC Course  I have reviewed the triage vital signs and the nursing notes.  Pertinent labs & imaging results that were available during my care of the patient were reviewed by me and considered in my medical decision making (see chart for details).     MDM: 1.  Potential exposure to STD-Aptima swab ordered; 2.  Dysuria-UA revealed above; 3.  Possible pregnancy-urine pregnancy negative this evening patient advised.  4.  Vaginal candidiasis-Rex Diflucan  200 mg tablet: Take 1 tablet p.o. now may repeat 1 tab p.o. in 3 days if symptoms are not resolved.  Advised patient take medication as directed.  Encouraged increase daily water intake to 64 ounces per day while taking this medication.  Advised if symptoms worsen and/or unresolved please follow-up with PCP or here for further evaluation advised patient we will  follow-up with Aptima swab results once received.  Advised if symptoms worsen and are unresolved please follow-up with your PCP, GYN, or here for further evaluation.  Patient discharged home, hemodynamically stable. Final Clinical Impressions(s) / UC Diagnoses   Final  diagnoses:  Potential exposure to STD  Dysuria  Possible pregnancy  Vaginal candidiasis     Discharge Instructions      Advised patient take medication as directed.  Encouraged increase daily water intake to 64 ounces per day while taking this medication.  Advised if symptoms worsen and/or unresolved please follow-up with PCP or here for further evaluation advised patient we will follow-up with Aptima swab results once received.  Advised if symptoms worsen and are unresolved please follow-up with your PCP, GYN, or here for further evaluation.     ED Prescriptions     Medication Sig Dispense Auth. Provider   fluconazole  (DIFLUCAN ) 200 MG tablet Take 1 tablet p.o. now may repeat 1 tablet p.o. in 3 days if symptoms or not resolved. 5 tablet Keevon Henney, FNP      PDMP not reviewed this encounter.    [1]  Social History Tobacco Use   Smoking status: Never   Smokeless tobacco: Never  Vaping Use   Vaping status: Never Used  Substance Use Topics   Alcohol use: No   Drug use: Yes    Types: Marijuana     Teddy Sharper, FNP 07/13/24 1940

## 2024-07-16 ENCOUNTER — Ambulatory Visit (HOSPITAL_COMMUNITY): Payer: Self-pay

## 2024-07-16 LAB — CERVICOVAGINAL ANCILLARY ONLY
Bacterial Vaginitis (gardnerella): POSITIVE — AB
Candida Glabrata: NEGATIVE
Candida Vaginitis: NEGATIVE
Chlamydia: NEGATIVE
Comment: NEGATIVE
Comment: NEGATIVE
Comment: NEGATIVE
Comment: NEGATIVE
Comment: NEGATIVE
Comment: NORMAL
Neisseria Gonorrhea: NEGATIVE
Trichomonas: NEGATIVE

## 2024-07-16 MED ORDER — METRONIDAZOLE 500 MG PO TABS: 500.0000 mg | ORAL_TABLET | Freq: Two times a day (BID) | ORAL | 0 refills | Status: AC

## 2024-11-29 ENCOUNTER — Ambulatory Visit: Admitting: Family Medicine
